# Patient Record
Sex: Female | Born: 1940 | Race: Black or African American | Hispanic: No | Smoking: Former smoker
Health system: Southern US, Community
[De-identification: ages and names within clinical notes are randomized; demographics above are authoritative.]

## PROBLEM LIST (undated history)

## (undated) DIAGNOSIS — N189 Chronic kidney disease, unspecified: Secondary | ICD-10-CM

## (undated) HISTORY — PX: COLONOSCOPY: SHX174

## (undated) HISTORY — PX: EYE SURGERY: SHX253

---

## 1998-07-19 ENCOUNTER — Other Ambulatory Visit: Admission: RE | Admit: 1998-07-19 | Discharge: 1998-07-19 | Payer: Self-pay | Admitting: Obstetrics and Gynecology

## 1999-04-28 ENCOUNTER — Other Ambulatory Visit: Admission: RE | Admit: 1999-04-28 | Discharge: 1999-04-28 | Payer: Self-pay | Admitting: Family Medicine

## 1999-06-08 ENCOUNTER — Encounter: Payer: Self-pay | Admitting: Family Medicine

## 1999-06-08 ENCOUNTER — Encounter: Admission: RE | Admit: 1999-06-08 | Discharge: 1999-06-08 | Payer: Self-pay | Admitting: Family Medicine

## 2000-06-11 ENCOUNTER — Encounter: Payer: Self-pay | Admitting: Family Medicine

## 2000-06-11 ENCOUNTER — Encounter: Admission: RE | Admit: 2000-06-11 | Discharge: 2000-06-11 | Payer: Self-pay | Admitting: Family Medicine

## 2000-06-27 ENCOUNTER — Other Ambulatory Visit: Admission: RE | Admit: 2000-06-27 | Discharge: 2000-06-27 | Payer: Self-pay | Admitting: Obstetrics and Gynecology

## 2001-06-17 ENCOUNTER — Encounter: Payer: Self-pay | Admitting: Family Medicine

## 2001-06-17 ENCOUNTER — Encounter: Admission: RE | Admit: 2001-06-17 | Discharge: 2001-06-17 | Payer: Self-pay | Admitting: Family Medicine

## 2001-09-08 ENCOUNTER — Other Ambulatory Visit: Admission: RE | Admit: 2001-09-08 | Discharge: 2001-09-08 | Payer: Self-pay | Admitting: Obstetrics and Gynecology

## 2002-07-14 ENCOUNTER — Encounter: Admission: RE | Admit: 2002-07-14 | Discharge: 2002-07-14 | Payer: Self-pay | Admitting: Family Medicine

## 2002-07-14 ENCOUNTER — Encounter: Payer: Self-pay | Admitting: Family Medicine

## 2003-01-20 ENCOUNTER — Other Ambulatory Visit: Admission: RE | Admit: 2003-01-20 | Discharge: 2003-01-20 | Payer: Self-pay | Admitting: Obstetrics and Gynecology

## 2003-07-16 ENCOUNTER — Ambulatory Visit (HOSPITAL_COMMUNITY): Admission: RE | Admit: 2003-07-16 | Discharge: 2003-07-16 | Payer: Self-pay | Admitting: Family Medicine

## 2004-08-10 ENCOUNTER — Encounter: Admission: RE | Admit: 2004-08-10 | Discharge: 2004-08-10 | Payer: Self-pay | Admitting: Family Medicine

## 2004-08-14 ENCOUNTER — Ambulatory Visit: Payer: Self-pay | Admitting: Family Medicine

## 2004-11-23 ENCOUNTER — Other Ambulatory Visit: Admission: RE | Admit: 2004-11-23 | Discharge: 2004-11-23 | Payer: Self-pay | Admitting: Family Medicine

## 2004-12-13 ENCOUNTER — Ambulatory Visit (HOSPITAL_COMMUNITY): Admission: RE | Admit: 2004-12-13 | Discharge: 2004-12-13 | Payer: Self-pay | Admitting: Gastroenterology

## 2005-10-08 ENCOUNTER — Encounter: Admission: RE | Admit: 2005-10-08 | Discharge: 2005-10-08 | Payer: Self-pay | Admitting: Family Medicine

## 2005-11-27 ENCOUNTER — Other Ambulatory Visit: Admission: RE | Admit: 2005-11-27 | Discharge: 2005-11-27 | Payer: Self-pay | Admitting: Family Medicine

## 2006-10-22 ENCOUNTER — Encounter: Admission: RE | Admit: 2006-10-22 | Discharge: 2006-10-22 | Payer: Self-pay | Admitting: Family Medicine

## 2007-07-28 ENCOUNTER — Encounter: Admission: RE | Admit: 2007-07-28 | Discharge: 2007-07-28 | Payer: Self-pay | Admitting: Family Medicine

## 2007-11-18 ENCOUNTER — Encounter: Admission: RE | Admit: 2007-11-18 | Discharge: 2007-11-18 | Payer: Self-pay | Admitting: Family Medicine

## 2008-11-18 ENCOUNTER — Encounter: Admission: RE | Admit: 2008-11-18 | Discharge: 2008-11-18 | Payer: Self-pay | Admitting: Family Medicine

## 2009-11-10 ENCOUNTER — Other Ambulatory Visit: Admission: RE | Admit: 2009-11-10 | Discharge: 2009-11-10 | Payer: Self-pay | Admitting: Family Medicine

## 2009-11-21 ENCOUNTER — Encounter: Admission: RE | Admit: 2009-11-21 | Discharge: 2009-11-21 | Payer: Self-pay | Admitting: Family Medicine

## 2010-05-21 ENCOUNTER — Encounter: Payer: Self-pay | Admitting: Family Medicine

## 2010-09-15 NOTE — Op Note (Signed)
NAME:  [name], [name]             ACCOUNT NO.:  [id number]   MEDICAL RECORD NO.:  [id number]          PATIENT TYPE:  AMB   LOCATION:  ENDO                         FACILITY:  [facility]   PHYSICIAN:  Anselmo Rod, M.D.  DATE OF BIRTH:  1940-06-16   DATE OF PROCEDURE:  12/13/2004  DATE OF DISCHARGE:                                 OPERATIVE REPORT   PROCEDURE PERFORMED:  Screening colonoscopy.   ENDOSCOPIST:  Charna Elizabeth, M.D.   INSTRUMENT USED:  Olympus video colonoscope.   INDICATIONS FOR PROCEDURE:  The patient is a 70 year old African-American  female undergoing screening colonoscopy to rule out colonic polyps, masses,  etc.   PREPROCEDURE PREPARATION:  Informed consent was procured from the patient.  The patient was fasted for eight hours prior to the procedure and prepped  with a bottle of magnesium citrate and a gallon of GoLYTELY the night prior  to the procedure.  The risks and benefits of the procedure including a 10%  miss rate for colon polyps or cancers was discussed with the patient as  well.   PREPROCEDURE PHYSICAL:  The patient had stable vital signs.  Neck supple.  Chest clear to auscultation.  S1 and S2 regular.  Abdomen soft with normal  bowel sounds.   DESCRIPTION OF PROCEDURE:  The patient was placed in left lateral decubitus  position and sedated with 60 mg of Demerol and 6 mg of Versed in slow  incremental doses.  Once the patient was adequately sedated and maintained  on low flow oxygen and continuous cardiac monitoring, the Olympus video  colonoscope was advanced from the rectum to the cecum with difficulty.  The  patient's position was changed from the left lateral to the supine position  with gentle application of abdominal pressure to reach the cecal base.  The  appendicular orifice and ileocecal valve were clearly visualized and  photographed.  No masses, polyps, erosions, ulcerations or diverticula were  seen.  Retroflexion in the rectum revealed no  abnormalities.  The patient  tolerated the procedure well without immediate complications.   IMPRESSION:  Normal colonoscopy up to the cecum.  The patient had a tortuous  colon.  No masses, polyps or diverticula seen.   RECOMMENDATIONS:  1.  Continue high fiber diet with liberal fluid intake.  2.  Repeat colonoscopy is recommended in the next 10 years unless the      patient develops any abnormal symptoms in the interim.  3.  Outpatient followup as need arises in the future.      Anselmo Rod, M.D.  Electronically Signed     JNM/MEDQ  D:  12/13/2004  T:  12/13/2004  Job:  [id number]   cc:   Renaye Rakers, M.D.  [phone] [address]  [place]  [place] [address]  Fax: [phone]

## 2010-10-13 ENCOUNTER — Ambulatory Visit (HOSPITAL_BASED_OUTPATIENT_CLINIC_OR_DEPARTMENT_OTHER)
Admission: RE | Admit: 2010-10-13 | Discharge: 2010-10-13 | Disposition: A | Discharge status: Home / Self Care | Payer: Medicare Other | Source: Ambulatory Visit | Attending: Orthopedic Surgery | Admitting: Orthopedic Surgery

## 2010-10-13 DIAGNOSIS — Z01812 Encounter for preprocedural laboratory examination: Secondary | ICD-10-CM | POA: Insufficient documentation

## 2010-10-13 DIAGNOSIS — M6740 Ganglion, unspecified site: Secondary | ICD-10-CM | POA: Insufficient documentation

## 2010-10-17 NOTE — Op Note (Signed)
NAME:  [name], [name]             ACCOUNT NO.:  [id number]  MEDICAL RECORD NO.:  [id number]  LOCATION:                                 FACILITY:  PHYSICIAN:  Katy Fitch. Derriana Oser, M.D.      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  OPERATION:  Resection of myxoid cyst on the dorsal aspect of right wrist with wrist arthrotomy and debridement of the cyst base at scapholunate ligament followed by electrocautery of superficial cells of scapholunate ligament.  OPERATIONS:  Katy Fitch. Kypton Eltringham, MD  ASSISTANT:  Marveen Reeks Dasnoit, PA-C  ANESTHESIA:  General by LMA.  SUPERVISING ANESTHESIOLOGIST:  Achille Rich, MD  INDICATIONS:  [name] is a 70 year old woman referred for evaluation and management of a mass on the dorsal aspect of her right wrist.  Her primary care physician is Dr. Renaye Rakers.  Clinical examination revealed a probable myxoid/ganglion cyst that was dissecting through the extensor retinaculum.  We reported her this was benign process that she could tolerate as long as she chose to.  It hurts and is uncomfortable.  She did not like the appearance of it and requested that we remove the cyst.  After informed consent by myself and Dr. Chaney Malling, she is brought to the operating room at this time anticipating general anesthesia by LMA technique.  Questions were invited and answered in detail preoperatively.  PROCEDURE:  [name] was brought to room #1 of the [facility] and placed in supine position upon the operating table. Following the induction of general  anesthesia by LMA technique under Dr. Seward Meth direct supervision, the right arm was prepped with Betadine soap and solution, sterilely draped.  A pneumatic tourniquet was applied to the proximal right brachium.  Following exsanguination of the right arm with Esmarch bandage, arterial tourniquet was inflated to 220 mmHg.  After routine surgical time-out, the procedure was commenced with a short transverse incision directly over the apex of the mass.  Subcutaneous tissues were carefully divided taking care to identify the extensor retinaculum.  The cyst was dissected through the retinaculum.  The cyst was circumferentially dissected and vessels passing through the wall of the cyst were electrocauterized with bipolar current.  The cyst was drained of about 90% of its contents and a right-angle clamp applied.  This was followed back through the dorsal capsule of the wrist joint which was dissected with microforceps, with electrocautery along the way of feeding vessels. The cyst followed a pathway directly through the scapholunate ligament. The cyst was amputated from the dorsal surface of the scapholunate ligament and a micro-rongeur was used to clean the superficial fibers and ligament.  The ligament was then electrodesiccated on the superficial surface to try to prevent recurrence.  The wound was then irrigated.  The wrist joint was inspected and no other pathology noted.  The capsule was  then allowed to simply close in anatomic layers.  The skin was repaired with subcutaneous 4-0 Vicryl and dermal 3-0 Prolene with Steri-Strips.  A compressive dressing was applied with volar plaster splint maintaining the wrist in 10 degrees of dorsiflexion.  There were no apparent complications.  For aftercare, [name] is provided prescription for Percocet 5 mg 1 p.o. q.4-6 h. p.r.n. pain, 20 tablets without refill. We will see her back for followup in approximately 7 days for dressing change,  suture removal, and advancement to an excise program.     Katy Fitch. Gildardo Tickner, M.D.     RVS/MEDQ  D:  10/13/2010  T:  10/13/2010  Job:  [id number]  Electronically Signed by Josephine Igo M.D. on 10/17/2010 08:28:10 AM

## 2010-12-15 ENCOUNTER — Other Ambulatory Visit: Payer: Self-pay | Admitting: Family Medicine

## 2010-12-15 DIAGNOSIS — Z1231 Encounter for screening mammogram for malignant neoplasm of breast: Secondary | ICD-10-CM

## 2010-12-21 ENCOUNTER — Ambulatory Visit
Admission: RE | Admit: 2010-12-21 | Discharge: 2010-12-21 | Disposition: A | Discharge status: Home / Self Care | Payer: Medicare Other | Source: Ambulatory Visit | Attending: Family Medicine | Admitting: Family Medicine

## 2010-12-21 DIAGNOSIS — Z1231 Encounter for screening mammogram for malignant neoplasm of breast: Secondary | ICD-10-CM

## 2011-02-07 ENCOUNTER — Encounter (HOSPITAL_COMMUNITY)
Admission: RE | Admit: 2011-02-07 | Discharge: 2011-02-07 | Disposition: A | Discharge status: Home / Self Care | Payer: Medicare Other | Source: Ambulatory Visit | Attending: Ophthalmology | Admitting: Ophthalmology

## 2011-02-07 LAB — CBC
HCT: 41.6 % (ref 36.0–46.0)
Hemoglobin: 14.2 g/dL (ref 12.0–15.0)
MCH: 29.2 pg (ref 26.0–34.0)
MCV: 85.6 fL (ref 78.0–100.0)
Platelets: 189 10*3/uL (ref 150–400)
RDW: 14.8 % (ref 11.5–15.5)
WBC: 4.7 10*3/uL (ref 4.0–10.5)

## 2011-02-14 ENCOUNTER — Ambulatory Visit (HOSPITAL_COMMUNITY)
Admission: RE | Admit: 2011-02-14 | Discharge: 2011-02-14 | Disposition: A | Discharge status: Home / Self Care | Payer: Medicare Other | Source: Ambulatory Visit | Attending: Ophthalmology | Admitting: Ophthalmology

## 2011-02-14 DIAGNOSIS — Z01818 Encounter for other preprocedural examination: Secondary | ICD-10-CM | POA: Insufficient documentation

## 2011-02-14 DIAGNOSIS — H269 Unspecified cataract: Secondary | ICD-10-CM | POA: Insufficient documentation

## 2011-02-18 NOTE — Op Note (Signed)
  NAME:  [name], [name]             ACCOUNT NO.:  [id number]  MEDICAL RECORD NO.:  [id number]  LOCATION:  SDSC                         FACILITY:  [facility]  PHYSICIAN:  Shade Flood, MD       DATE OF BIRTH:  Nov 04, 1940  DATE OF PROCEDURE:  02/14/2011 DATE OF DISCHARGE:                              OPERATIVE REPORT   PREOPERATIVE DIAGNOSIS:  Cataract, right eye.  POSTOPERATIVE DIAGNOSIS:  Cataract, right eye.  PROCEDURE PERFORMED:  Phacoemulsification with posterior chamber intraocular lens, right eye.  SURGEON:  Shade Flood, MD  COMPLICATIONS:  There were no complications.  SPECIMENS:  No specimens for Pathology.  DESCRIPTION OF PROCEDURE:  The patient was prepared and draped in usual fashion for ocular surgery on the right eye and a solid lid speculum was placed.  Calipers were used to show 3.5 mm at the surgical limbus centered at the 11 o'clock meridian.  A Grieshaber 0.1 blade was used to make a peripheral partial-thickness corneal groove.  A 15-degree blade was used to enter the anterior chamber at the 2:30 meridian of the surgical limbus.  Provisc was instilled and then a keratome was used to make a self-sealing corneal incision at the 11 o'clock meridian.  A bent 25-gauge needle was then used to perform the capsulorrhexis.  The lens was hydrodissected using balanced salt solution, and then the Chang chopper was used to rotate the lens within the capsular bag to ensure adequate mobility.  The phaco handpiece and Chang chopper were then employed to use a combined Phaoc/Chop technique, removing the nucleus without complication.  Once the nucleus was removed, the IA cannula was used to remove the residual cortex from the capsular bag.  Provisc was again instilled and the wound enlarged to 3.5 mm.   The Monarch injector was then used to place a folded AcrySof MA50BM lens into the capsule bag.  The Sinskey lens hook was then used to dial the trailing haptic into the bag and  the IA cannula was used to remove the viscoelastic from the anterior chamber.  The wound was tested for water tightness and it did appear to self-seal well.  The patient was given 100 mg of Ancef subconjunctivally with the needle visualized and 2 mg Decadron inferiorly subconjunctivally also with the needle visualized.  The lid speculum was removed and the patient's eye was patched using polymyxin/bacitracin ophthalmic ointment.  A plastic shield was placed and she was transferred alert and conversant from the operating room to the postoperative recovery area.         ______________________________ Shade Flood, MD    GG/MEDQ  D:  02/14/2011  T:  02/15/2011  Job:  [id number]  Electronically Signed by Shade Flood MD on 02/18/2011 06:09:47 PM

## 2011-05-11 DIAGNOSIS — H20019 Primary iridocyclitis, unspecified eye: Secondary | ICD-10-CM | POA: Diagnosis not present

## 2011-05-11 DIAGNOSIS — H5710 Ocular pain, unspecified eye: Secondary | ICD-10-CM | POA: Diagnosis not present

## 2011-06-04 DIAGNOSIS — H2510 Age-related nuclear cataract, unspecified eye: Secondary | ICD-10-CM | POA: Diagnosis not present

## 2011-06-04 DIAGNOSIS — H20019 Primary iridocyclitis, unspecified eye: Secondary | ICD-10-CM | POA: Diagnosis not present

## 2011-07-02 DIAGNOSIS — I10 Essential (primary) hypertension: Secondary | ICD-10-CM | POA: Diagnosis not present

## 2011-07-02 DIAGNOSIS — E559 Vitamin D deficiency, unspecified: Secondary | ICD-10-CM | POA: Diagnosis not present

## 2011-07-05 DIAGNOSIS — H25019 Cortical age-related cataract, unspecified eye: Secondary | ICD-10-CM | POA: Diagnosis not present

## 2011-07-05 DIAGNOSIS — H20019 Primary iridocyclitis, unspecified eye: Secondary | ICD-10-CM | POA: Diagnosis not present

## 2011-08-08 ENCOUNTER — Other Ambulatory Visit (HOSPITAL_COMMUNITY)
Admission: RE | Admit: 2011-08-08 | Discharge: 2011-08-08 | Disposition: A | Discharge status: Home / Self Care | Payer: Medicare Other | Source: Ambulatory Visit | Attending: Family Medicine | Admitting: Family Medicine

## 2011-08-08 DIAGNOSIS — E119 Type 2 diabetes mellitus without complications: Secondary | ICD-10-CM | POA: Diagnosis not present

## 2011-08-08 DIAGNOSIS — Z124 Encounter for screening for malignant neoplasm of cervix: Secondary | ICD-10-CM | POA: Diagnosis not present

## 2011-08-23 DIAGNOSIS — N760 Acute vaginitis: Secondary | ICD-10-CM | POA: Diagnosis not present

## 2011-08-23 DIAGNOSIS — E7800 Pure hypercholesterolemia, unspecified: Secondary | ICD-10-CM | POA: Diagnosis not present

## 2011-08-31 DIAGNOSIS — N909 Noninflammatory disorder of vulva and perineum, unspecified: Secondary | ICD-10-CM | POA: Diagnosis not present

## 2011-08-31 DIAGNOSIS — N9089 Other specified noninflammatory disorders of vulva and perineum: Secondary | ICD-10-CM | POA: Diagnosis not present

## 2011-08-31 DIAGNOSIS — N766 Ulceration of vulva: Secondary | ICD-10-CM | POA: Diagnosis not present

## 2011-09-07 DIAGNOSIS — N766 Ulceration of vulva: Secondary | ICD-10-CM | POA: Diagnosis not present

## 2011-09-07 DIAGNOSIS — N909 Noninflammatory disorder of vulva and perineum, unspecified: Secondary | ICD-10-CM | POA: Diagnosis not present

## 2011-10-09 DIAGNOSIS — L259 Unspecified contact dermatitis, unspecified cause: Secondary | ICD-10-CM | POA: Diagnosis not present

## 2011-10-09 DIAGNOSIS — L940 Localized scleroderma [morphea]: Secondary | ICD-10-CM | POA: Diagnosis not present

## 2011-12-04 DIAGNOSIS — H04129 Dry eye syndrome of unspecified lacrimal gland: Secondary | ICD-10-CM | POA: Diagnosis not present

## 2011-12-04 DIAGNOSIS — H2510 Age-related nuclear cataract, unspecified eye: Secondary | ICD-10-CM | POA: Diagnosis not present

## 2011-12-04 DIAGNOSIS — Z961 Presence of intraocular lens: Secondary | ICD-10-CM | POA: Diagnosis not present

## 2011-12-21 DIAGNOSIS — E7800 Pure hypercholesterolemia, unspecified: Secondary | ICD-10-CM | POA: Diagnosis not present

## 2011-12-21 DIAGNOSIS — I10 Essential (primary) hypertension: Secondary | ICD-10-CM | POA: Diagnosis not present

## 2012-01-01 ENCOUNTER — Other Ambulatory Visit: Payer: Self-pay | Admitting: Family Medicine

## 2012-01-01 DIAGNOSIS — Z1231 Encounter for screening mammogram for malignant neoplasm of breast: Secondary | ICD-10-CM

## 2012-01-16 DIAGNOSIS — H01139 Eczematous dermatitis of unspecified eye, unspecified eyelid: Secondary | ICD-10-CM | POA: Diagnosis not present

## 2012-01-16 DIAGNOSIS — L940 Localized scleroderma [morphea]: Secondary | ICD-10-CM | POA: Diagnosis not present

## 2012-01-17 ENCOUNTER — Ambulatory Visit
Admission: RE | Admit: 2012-01-17 | Discharge: 2012-01-17 | Disposition: A | Discharge status: Home / Self Care | Payer: Medicare Other | Source: Ambulatory Visit | Attending: Family Medicine | Admitting: Family Medicine

## 2012-01-17 DIAGNOSIS — Z1231 Encounter for screening mammogram for malignant neoplasm of breast: Secondary | ICD-10-CM | POA: Diagnosis not present

## 2012-04-16 DIAGNOSIS — L940 Localized scleroderma [morphea]: Secondary | ICD-10-CM | POA: Diagnosis not present

## 2012-04-16 DIAGNOSIS — L819 Disorder of pigmentation, unspecified: Secondary | ICD-10-CM | POA: Diagnosis not present

## 2012-05-20 DIAGNOSIS — M7989 Other specified soft tissue disorders: Secondary | ICD-10-CM | POA: Diagnosis not present

## 2012-05-20 DIAGNOSIS — E7800 Pure hypercholesterolemia, unspecified: Secondary | ICD-10-CM | POA: Diagnosis not present

## 2012-06-23 DIAGNOSIS — H35359 Cystoid macular degeneration, unspecified eye: Secondary | ICD-10-CM | POA: Diagnosis not present

## 2012-07-11 ENCOUNTER — Encounter (HOSPITAL_COMMUNITY): Payer: Self-pay | Admitting: Pharmacy Technician

## 2012-07-25 ENCOUNTER — Encounter: Payer: Self-pay | Admitting: Ophthalmology

## 2012-07-25 ENCOUNTER — Encounter (HOSPITAL_COMMUNITY)
Admission: RE | Disposition: A | Discharge status: Home / Self Care | Payer: Self-pay | Source: Ambulatory Visit | Attending: Ophthalmology

## 2012-07-25 ENCOUNTER — Ambulatory Visit (HOSPITAL_COMMUNITY)
Admission: RE | Admit: 2012-07-25 | Discharge: 2012-07-25 | Disposition: A | Discharge status: Home / Self Care | Payer: Medicare Other | Source: Ambulatory Visit | Attending: Ophthalmology | Admitting: Ophthalmology

## 2012-07-25 ENCOUNTER — Other Ambulatory Visit: Payer: Self-pay | Admitting: Ophthalmology

## 2012-07-25 ENCOUNTER — Encounter (HOSPITAL_COMMUNITY): Payer: Self-pay | Admitting: Ophthalmology

## 2012-07-25 DIAGNOSIS — Z87891 Personal history of nicotine dependence: Secondary | ICD-10-CM | POA: Diagnosis not present

## 2012-07-25 DIAGNOSIS — E7800 Pure hypercholesterolemia, unspecified: Secondary | ICD-10-CM | POA: Diagnosis not present

## 2012-07-25 DIAGNOSIS — H25019 Cortical age-related cataract, unspecified eye: Secondary | ICD-10-CM | POA: Insufficient documentation

## 2012-07-25 DIAGNOSIS — Z79899 Other long term (current) drug therapy: Secondary | ICD-10-CM | POA: Diagnosis not present

## 2012-07-25 DIAGNOSIS — Z7982 Long term (current) use of aspirin: Secondary | ICD-10-CM | POA: Diagnosis not present

## 2012-07-25 DIAGNOSIS — H26499 Other secondary cataract, unspecified eye: Secondary | ICD-10-CM | POA: Diagnosis not present

## 2012-07-25 DIAGNOSIS — H35359 Cystoid macular degeneration, unspecified eye: Secondary | ICD-10-CM | POA: Insufficient documentation

## 2012-07-25 DIAGNOSIS — H04129 Dry eye syndrome of unspecified lacrimal gland: Secondary | ICD-10-CM | POA: Insufficient documentation

## 2012-07-25 DIAGNOSIS — H2510 Age-related nuclear cataract, unspecified eye: Secondary | ICD-10-CM | POA: Diagnosis not present

## 2012-07-25 HISTORY — PX: CAPSULOTOMY: SHX5412

## 2012-07-25 SURGERY — MINOR CAPSULOTOMY
Anesthesia: LOCAL | Site: Eye | Laterality: Right

## 2012-07-25 MED ORDER — CYCLOPENTOLATE-PHENYLEPHRINE 0.2-1 % OP SOLN: 2.0000 [drp] | Freq: Once | OPHTHALMIC | Status: DC

## 2012-07-25 MED ORDER — APRACLONIDINE HCL 1 % OP SOLN
1.0000 [drp] | Freq: Once | OPHTHALMIC | Status: AC
  Administered 2012-07-25: 1 [drp] via OPHTHALMIC
  Filled 2012-07-25: qty 0.1

## 2012-07-25 MED ORDER — APRACLONIDINE HCL 1 % OP SOLN: 1.0000 [drp] | Freq: Once | OPHTHALMIC | Status: DC

## 2012-07-25 MED ORDER — APRACLONIDINE HCL 0.5 % OP SOLN
2.0000 [drp] | Freq: Once | OPHTHALMIC | Status: DC
  Filled 2012-07-25 (×2): qty 5

## 2012-07-25 SURGICAL SUPPLY — 28 items
APPLICATOR COTTON TIP 6IN STRL (MISCELLANEOUS) ×2 IMPLANT
BAG FLD CLT MN 6.25X3.5 (WOUND CARE)
BAG MINI COLL DRAIN (WOUND CARE) IMPLANT
BLADE KERATOME 2.75 (BLADE) ×2 IMPLANT
BLADE MINI RND TIP GREEN BEAV (BLADE) IMPLANT
CLOTH BEACON ORANGE TIMEOUT ST (SAFETY) ×2 IMPLANT
CORDS BIPOLAR (ELECTRODE) ×2 IMPLANT
DRAPE OPHTHALMIC 40X48 W POUCH (DRAPES) ×2 IMPLANT
DRAPE RETRACTOR (MISCELLANEOUS) ×2 IMPLANT
GLOVE ECLIPSE 7.0 STRL STRAW (GLOVE) ×2 IMPLANT
GOWN STRL NON-REIN LRG LVL3 (GOWN DISPOSABLE) ×4 IMPLANT
KIT BASIN OR (CUSTOM PROCEDURE TRAY) ×2 IMPLANT
KIT ROOM TURNOVER OR (KITS) ×2 IMPLANT
KNIFE CRESCENT 2.5 55 ANG (BLADE) ×2 IMPLANT
MARKER SKIN DUAL TIP RULER LAB (MISCELLANEOUS) IMPLANT
NS IRRIG 1000ML POUR BTL (IV SOLUTION) ×2 IMPLANT
PACK CATARACT CUSTOM (CUSTOM PROCEDURE TRAY) ×2 IMPLANT
PACK CATARACT MCHSCP (PACKS) IMPLANT
PAD ARMBOARD 7.5X6 YLW CONV (MISCELLANEOUS) ×4 IMPLANT
PROBE ANTERIOR 20G W/INFUS NDL (MISCELLANEOUS) IMPLANT
SPEAR EYE SURG WECK-CEL (MISCELLANEOUS) IMPLANT
SUT ETHILON 10 0 CS140 6 (SUTURE) IMPLANT
SUT VICRYL 8 0 TG140 8 (SUTURE) IMPLANT
SYR 3ML LL SCALE MARK (SYRINGE) IMPLANT
TIP SILICONE STR (MISCELLANEOUS)
TIP SILICONE STR 0.3MM UFLOW (MISCELLANEOUS) IMPLANT
TOWEL OR 17X24 6PK STRL BLUE (TOWEL DISPOSABLE) ×4 IMPLANT
WATER STERILE IRR 1000ML POUR (IV SOLUTION) ×2 IMPLANT

## 2012-07-25 NOTE — Progress Notes (Signed)
Spoke with Dr. Mitzi Davenport and he stated he only wants iopodine given (1 gtt pre/post procedure).

## 2012-07-25 NOTE — H&P (Signed)
  72 yo female has had cataract surgery right eye.  C/O blurred vision.  Found to have a cloudy posterior capsule.  Admitted for yag laser capsulotomy capsulotomy right eye.

## 2012-07-25 NOTE — Progress Notes (Signed)
Patient discharged to home and will follow up today at 1700 in Dr. Jacelyn Pi office. Patient verbalized understanding.

## 2012-07-25 NOTE — Brief Op Note (Signed)
07/25/2012  7:56 AM  PATIENT:  [name]  72 y.o. female  PRE-OPERATIVE DIAGNOSIS:  opaque posterior capsule right eye   POST-OPERATIVE DIAGNOSIS:  * No post-op diagnosis entered *  PROCEDURE:  Procedure(s): MINOR PROCEDURE YAG LASER CAPSULOTOMY (Right)  SURGEON:  Surgeon(s) and Role:    Vita Erm., MD - Primary  PHYSICIAN ASSISTANT:   ASSISTANTS: none   ANESTHESIA:   none  EBL:     BLOOD ADMINISTERED:none  DRAINS: none   LOCAL MEDICATIONS USED:  NONE  SPECIMEN:  No Specimen  DISPOSITION OF SPECIMEN:  N/A  COUNTS:  YES  TOURNIQUET:  * No tourniquets in log *  DICTATION: .Other Dictation: Dictation Number [id number]  PLAN OF CARE: Discharge to home after PACU  PATIENT DISPOSITION:  Short Stay   Delay start of Pharmacological VTE agent (>24hrs) due to surgical blood loss or risk of bleeding: not applicable

## 2012-07-28 ENCOUNTER — Encounter (HOSPITAL_COMMUNITY): Payer: Self-pay | Admitting: Ophthalmology

## 2012-08-01 NOTE — Op Note (Signed)
NAME:  BARRIE, SIGMUND                  ACCOUNT NO.:  MEDICAL RECORD NO.:  [id number]  LOCATION:                                 FACILITY:  PHYSICIAN:  Salley Scarlet., M.D.DATE OF BIRTH:  [birth year]  DATE OF PROCEDURE: DATE OF DISCHARGE:                              OPERATIVE REPORT   PREOPERATIVE DIAGNOSIS:  Opaque posterior capsule, right eye.  POSTOPERATIVE DIAGNOSIS:  Opaque posterior capsule, right eye.  OPERATION:  YAG laser capsulotomy.  JUSTIFICATION FOR PROCEDURE:  This is a 72 year old lady who had a cataract extraction from the right eye several months ago.  She complains of blurring of vision.  She was evaluated for having an opaque posterior capsule of the right eye.  YAG laser capsulotomy was recommended.  She is admitted at this time for that purpose.  DESCRIPTION OF PROCEDURE:  The patient was brought to the laser room and positioned appropriately behind the YAG laser.  Approximately 25 applications of laser energy, 1.8 mJ applied to the posterior capsule obtaining a satisfactory opening.  The patient tolerated the procedure well and was discharged in satisfactory condition, with instructions to see me in office this afternoon for further evaluation.  DISCHARGE DIAGNOSIS:  Opaque posterior capsule, right eye.     Salley Scarlet., M.D.     TB/MEDQ  D:  07/31/2012  T:  08/01/2012  Job:  [id number]

## 2012-09-15 DIAGNOSIS — I10 Essential (primary) hypertension: Secondary | ICD-10-CM | POA: Diagnosis not present

## 2012-09-17 DIAGNOSIS — E7800 Pure hypercholesterolemia, unspecified: Secondary | ICD-10-CM | POA: Diagnosis not present

## 2012-09-17 DIAGNOSIS — I10 Essential (primary) hypertension: Secondary | ICD-10-CM | POA: Diagnosis not present

## 2012-10-07 DIAGNOSIS — L940 Localized scleroderma [morphea]: Secondary | ICD-10-CM | POA: Diagnosis not present

## 2012-10-07 DIAGNOSIS — L819 Disorder of pigmentation, unspecified: Secondary | ICD-10-CM | POA: Diagnosis not present

## 2013-01-07 ENCOUNTER — Other Ambulatory Visit: Payer: Self-pay

## 2013-01-07 DIAGNOSIS — Z1231 Encounter for screening mammogram for malignant neoplasm of breast: Secondary | ICD-10-CM

## 2013-01-26 DIAGNOSIS — H04129 Dry eye syndrome of unspecified lacrimal gland: Secondary | ICD-10-CM | POA: Diagnosis not present

## 2013-01-26 DIAGNOSIS — H2510 Age-related nuclear cataract, unspecified eye: Secondary | ICD-10-CM | POA: Diagnosis not present

## 2013-01-30 ENCOUNTER — Ambulatory Visit
Admission: RE | Admit: 2013-01-30 | Discharge: 2013-01-30 | Disposition: A | Discharge status: Home / Self Care | Payer: Medicare Other | Source: Ambulatory Visit

## 2013-01-30 DIAGNOSIS — Z1231 Encounter for screening mammogram for malignant neoplasm of breast: Secondary | ICD-10-CM

## 2013-02-03 DIAGNOSIS — L940 Localized scleroderma [morphea]: Secondary | ICD-10-CM | POA: Diagnosis not present

## 2013-02-16 DIAGNOSIS — I10 Essential (primary) hypertension: Secondary | ICD-10-CM | POA: Diagnosis not present

## 2013-02-16 DIAGNOSIS — H902 Conductive hearing loss, unspecified: Secondary | ICD-10-CM | POA: Diagnosis not present

## 2013-02-16 DIAGNOSIS — G589 Mononeuropathy, unspecified: Secondary | ICD-10-CM | POA: Diagnosis not present

## 2013-02-16 DIAGNOSIS — G5790 Unspecified mononeuropathy of unspecified lower limb: Secondary | ICD-10-CM | POA: Diagnosis not present

## 2013-03-03 DIAGNOSIS — H93299 Other abnormal auditory perceptions, unspecified ear: Secondary | ICD-10-CM | POA: Diagnosis not present

## 2013-03-04 DIAGNOSIS — G603 Idiopathic progressive neuropathy: Secondary | ICD-10-CM | POA: Diagnosis not present

## 2013-04-13 DIAGNOSIS — H1045 Other chronic allergic conjunctivitis: Secondary | ICD-10-CM | POA: Diagnosis not present

## 2013-04-13 DIAGNOSIS — H02049 Spastic entropion of unspecified eye, unspecified eyelid: Secondary | ICD-10-CM | POA: Diagnosis not present

## 2013-05-07 DIAGNOSIS — H04129 Dry eye syndrome of unspecified lacrimal gland: Secondary | ICD-10-CM | POA: Diagnosis not present

## 2013-05-10 DIAGNOSIS — H5710 Ocular pain, unspecified eye: Secondary | ICD-10-CM | POA: Diagnosis not present

## 2013-05-10 DIAGNOSIS — H20019 Primary iridocyclitis, unspecified eye: Secondary | ICD-10-CM | POA: Diagnosis not present

## 2013-05-20 DIAGNOSIS — H5710 Ocular pain, unspecified eye: Secondary | ICD-10-CM | POA: Diagnosis not present

## 2013-05-20 DIAGNOSIS — H20019 Primary iridocyclitis, unspecified eye: Secondary | ICD-10-CM | POA: Diagnosis not present

## 2013-06-10 DIAGNOSIS — H20019 Primary iridocyclitis, unspecified eye: Secondary | ICD-10-CM | POA: Diagnosis not present

## 2013-06-10 DIAGNOSIS — H16229 Keratoconjunctivitis sicca, not specified as Sjogren's, unspecified eye: Secondary | ICD-10-CM | POA: Diagnosis not present

## 2013-08-10 DIAGNOSIS — E119 Type 2 diabetes mellitus without complications: Secondary | ICD-10-CM | POA: Diagnosis not present

## 2013-08-10 DIAGNOSIS — I10 Essential (primary) hypertension: Secondary | ICD-10-CM | POA: Diagnosis not present

## 2013-09-04 DIAGNOSIS — H2589 Other age-related cataract: Secondary | ICD-10-CM | POA: Diagnosis not present

## 2014-01-07 DIAGNOSIS — E669 Obesity, unspecified: Secondary | ICD-10-CM | POA: Diagnosis not present

## 2014-01-07 DIAGNOSIS — I10 Essential (primary) hypertension: Secondary | ICD-10-CM | POA: Diagnosis not present

## 2014-01-07 DIAGNOSIS — E119 Type 2 diabetes mellitus without complications: Secondary | ICD-10-CM | POA: Diagnosis not present

## 2014-01-18 ENCOUNTER — Other Ambulatory Visit: Payer: Self-pay

## 2014-01-18 DIAGNOSIS — Z1231 Encounter for screening mammogram for malignant neoplasm of breast: Secondary | ICD-10-CM

## 2014-02-01 ENCOUNTER — Ambulatory Visit: Payer: Medicare Other

## 2014-02-03 DIAGNOSIS — L900 Lichen sclerosus et atrophicus: Secondary | ICD-10-CM | POA: Diagnosis not present

## 2014-02-03 DIAGNOSIS — L810 Postinflammatory hyperpigmentation: Secondary | ICD-10-CM | POA: Diagnosis not present

## 2014-02-10 ENCOUNTER — Ambulatory Visit
Admission: RE | Admit: 2014-02-10 | Discharge: 2014-02-10 | Disposition: A | Discharge status: Home / Self Care | Payer: Medicare Other | Source: Ambulatory Visit

## 2014-02-10 DIAGNOSIS — Z1231 Encounter for screening mammogram for malignant neoplasm of breast: Secondary | ICD-10-CM | POA: Diagnosis not present

## 2014-03-04 DIAGNOSIS — H2512 Age-related nuclear cataract, left eye: Secondary | ICD-10-CM | POA: Diagnosis not present

## 2014-03-04 DIAGNOSIS — H04123 Dry eye syndrome of bilateral lacrimal glands: Secondary | ICD-10-CM | POA: Diagnosis not present

## 2014-03-04 DIAGNOSIS — H43813 Vitreous degeneration, bilateral: Secondary | ICD-10-CM | POA: Diagnosis not present

## 2014-04-15 DIAGNOSIS — Z0000 Encounter for general adult medical examination without abnormal findings: Secondary | ICD-10-CM | POA: Diagnosis not present

## 2014-06-21 ENCOUNTER — Encounter (HOSPITAL_COMMUNITY): Payer: Self-pay | Admitting: Emergency Medicine

## 2014-06-21 DIAGNOSIS — B9689 Other specified bacterial agents as the cause of diseases classified elsewhere: Secondary | ICD-10-CM | POA: Diagnosis not present

## 2014-06-21 DIAGNOSIS — R030 Elevated blood-pressure reading, without diagnosis of hypertension: Secondary | ICD-10-CM | POA: Diagnosis not present

## 2014-06-21 DIAGNOSIS — Z79899 Other long term (current) drug therapy: Secondary | ICD-10-CM | POA: Insufficient documentation

## 2014-06-21 DIAGNOSIS — R110 Nausea: Secondary | ICD-10-CM | POA: Insufficient documentation

## 2014-06-21 DIAGNOSIS — N390 Urinary tract infection, site not specified: Secondary | ICD-10-CM | POA: Diagnosis not present

## 2014-06-21 DIAGNOSIS — R42 Dizziness and giddiness: Secondary | ICD-10-CM | POA: Insufficient documentation

## 2014-06-21 DIAGNOSIS — R51 Headache: Secondary | ICD-10-CM | POA: Insufficient documentation

## 2014-06-21 DIAGNOSIS — Z7982 Long term (current) use of aspirin: Secondary | ICD-10-CM | POA: Insufficient documentation

## 2014-06-21 LAB — BASIC METABOLIC PANEL
ANION GAP: 8 (ref 5–15)
BUN: 17 mg/dL (ref 6–23)
CO2: 25 mmol/L (ref 19–32)
Calcium: 9.6 mg/dL (ref 8.4–10.5)
Chloride: 105 mmol/L (ref 96–112)
Creatinine, Ser: 0.79 mg/dL (ref 0.50–1.10)
GFR, EST NON AFRICAN AMERICAN: 79 mL/min — AB (ref >90)
GLUCOSE: 111 mg/dL — AB (ref 70–99)
Potassium: 3.8 mmol/L (ref 3.5–5.1)
Sodium: 138 mmol/L (ref 135–145)

## 2014-06-21 LAB — CBC
HCT: 39 % (ref 36.0–46.0)
Hemoglobin: 13.3 g/dL (ref 12.0–15.0)
MCH: 28.5 pg (ref 26.0–34.0)
MCHC: 34.1 g/dL (ref 30.0–36.0)
MCV: 83.7 fL (ref 78.0–100.0)
Platelets: 169 10*3/uL (ref 150–400)
RBC: 4.66 MIL/uL (ref 3.87–5.11)
RDW: 14.4 % (ref 11.5–15.5)
WBC: 3.6 10*3/uL — ABNORMAL LOW (ref 4.0–10.5)

## 2014-06-21 NOTE — ED Notes (Signed)
Pt reports feeling "swimmy headed" at home so she checked her BP- found BP to be 170 systolic- denies hx of hypertension.  Pt states "I just don't feel right".  Neuro exam normal, pt denies any pain or headache.  No acute distress noted.

## 2014-06-22 ENCOUNTER — Other Ambulatory Visit: Payer: Self-pay

## 2014-06-22 ENCOUNTER — Encounter (HOSPITAL_COMMUNITY): Payer: Self-pay

## 2014-06-22 ENCOUNTER — Emergency Department (HOSPITAL_COMMUNITY): Payer: Medicare Other

## 2014-06-22 ENCOUNTER — Emergency Department (HOSPITAL_COMMUNITY)
Admission: EM | Admit: 2014-06-22 | Discharge: 2014-06-22 | Disposition: A | Discharge status: Home / Self Care | Payer: Medicare Other | Attending: Emergency Medicine | Admitting: Emergency Medicine

## 2014-06-22 DIAGNOSIS — R030 Elevated blood-pressure reading, without diagnosis of hypertension: Secondary | ICD-10-CM

## 2014-06-22 DIAGNOSIS — R42 Dizziness and giddiness: Secondary | ICD-10-CM

## 2014-06-22 DIAGNOSIS — N390 Urinary tract infection, site not specified: Secondary | ICD-10-CM

## 2014-06-22 DIAGNOSIS — IMO0001 Reserved for inherently not codable concepts without codable children: Secondary | ICD-10-CM

## 2014-06-22 DIAGNOSIS — R51 Headache: Secondary | ICD-10-CM | POA: Diagnosis not present

## 2014-06-22 LAB — URINE MICROSCOPIC-ADD ON

## 2014-06-22 LAB — URINALYSIS, ROUTINE W REFLEX MICROSCOPIC
BILIRUBIN URINE: NEGATIVE
GLUCOSE, UA: NEGATIVE mg/dL
HGB URINE DIPSTICK: NEGATIVE
KETONES UR: NEGATIVE mg/dL
Nitrite: NEGATIVE
PH: 6 (ref 5.0–8.0)
PROTEIN: NEGATIVE mg/dL
Specific Gravity, Urine: 1.012 (ref 1.005–1.030)
Urobilinogen, UA: 0.2 mg/dL (ref 0.0–1.0)

## 2014-06-22 MED ORDER — MECLIZINE HCL 50 MG PO TABS: 25.0000 mg | ORAL_TABLET | Freq: Three times a day (TID) | ORAL | Status: DC | PRN

## 2014-06-22 MED ORDER — CEPHALEXIN 250 MG PO CAPS: 250.0000 mg | ORAL_CAPSULE | Freq: Two times a day (BID) | ORAL | Status: DC

## 2014-06-22 NOTE — Discharge Instructions (Signed)
Follow-up with ENT for persistent symptoms. If your symptoms worsen, you are unable to walk, you have persistent vomiting or focal weakness or any concerns return to the emergency department.   Vertigo Vertigo means you feel like you or your surroundings are moving when they are not. Vertigo can be dangerous if it occurs when you are at work, driving, or performing difficult activities.  CAUSES  Vertigo occurs when there is a conflict of signals sent to your brain from the visual and sensory systems in your body. There are many different causes of vertigo, including:  Infections, especially in the inner ear.  A bad reaction to a drug or misuse of alcohol and medicines.  Withdrawal from drugs or alcohol.  Rapidly changing positions, such as lying down or rolling over in bed.  A migraine headache.  Decreased blood flow to the brain.  Increased pressure in the brain from a head injury, infection, tumor, or bleeding. SYMPTOMS  You may feel as though the world is spinning around or you are falling to the ground. Because your balance is upset, vertigo can cause nausea and vomiting. You may have involuntary eye movements (nystagmus). DIAGNOSIS  Vertigo is usually diagnosed by physical exam. If the cause of your vertigo is unknown, your caregiver may perform imaging tests, such as an MRI scan (magnetic resonance imaging). TREATMENT  Most cases of vertigo resolve on their own, without treatment. Depending on the cause, your caregiver may prescribe certain medicines. If your vertigo is related to body position issues, your caregiver may recommend movements or procedures to correct the problem. In rare cases, if your vertigo is caused by certain inner ear problems, you may need surgery. HOME CARE INSTRUCTIONS   Follow your caregiver's instructions.  Avoid driving.  Avoid operating heavy machinery.  Avoid performing any tasks that would be dangerous to you or others during a vertigo  episode.  Tell your caregiver if you notice that certain medicines seem to be causing your vertigo. Some of the medicines used to treat vertigo episodes can actually make them worse in some people. SEEK IMMEDIATE MEDICAL CARE IF:   Your medicines do not relieve your vertigo or are making it worse.  You develop problems with talking, walking, weakness, or using your arms, hands, or legs.  You develop severe headaches.  Your nausea or vomiting continues or gets worse.  You develop visual changes.  A family member notices behavioral changes.  Your condition gets worse. MAKE SURE YOU:  Understand these instructions.  Will watch your condition.  Will get help right away if you are not doing well or get worse. Document Released: 01/24/2005 Document Revised: 07/09/2011 Document Reviewed: 11/02/2010 [facility] Patient Information 2015 [place], [place]. This information is not intended to replace advice given to you by your health care provider. Make sure you discuss any questions you have with your health care provider.

## 2014-06-22 NOTE — ED Provider Notes (Signed)
CSN: [id number]     Arrival date & time 06/21/14  2129 History  This chart was scribed for Loren Racer, MD by Abel Presto, ED Scribe. This patient was seen in room A02C/A02C and the patient's care was started at 2:49 AM.      Chief Complaint  Patient presents with  . Hypertension     Patient is a 74 y.o. female presenting with hypertension. The history is provided by the patient. No language interpreter was used.  Hypertension Associated symptoms include headaches. Pertinent negatives include no abdominal pain and no shortness of breath.   HPI Comments: [name] is a 74 y.o. female who presents to the Emergency Department complaining of intermittent dizziness today, described as spinning sensation. Pt notes she was drinking coffee at first onset. Pt notes mild headache associated nausea but states it was not during episode of dizziness. Pt states systolic BP was 159. Pt denies h/o HTN and CVA. Pt denies facial numbness, congestion, rhinorrhea, tinnitus, focal weakness or numbness, visual changes and vomiting.  History reviewed. No pertinent past medical history. Past Surgical History  Procedure Laterality Date  . Capsulotomy Right 07/25/2012    Procedure: MINOR PROCEDURE YAG LASER CAPSULOTOMY;  Surgeon: Vita Erm., MD;  Location: [facility] OR;  Service: Ophthalmology;  Laterality: Right;   No family history on file. History  Substance Use Topics  . Smoking status: Not on file  . Smokeless tobacco: Not on file  . Alcohol Use: Not on file   OB History    No data available     Review of Systems  Constitutional: Negative for fever and chills.  HENT: Negative for congestion, ear pain, hearing loss, rhinorrhea and tinnitus.   Eyes: Negative for photophobia and visual disturbance.  Respiratory: Negative for chest tightness and shortness of breath.   Gastrointestinal: Positive for nausea. Negative for vomiting, abdominal pain, diarrhea and constipation.   Genitourinary: Negative for dysuria, frequency, hematuria and flank pain.  Musculoskeletal: Negative for back pain, neck pain and neck stiffness.  Skin: Negative for rash and wound.  Neurological: Positive for dizziness and headaches. Negative for syncope, weakness and numbness.  All other systems reviewed and are negative.     Allergies  Review of patient's allergies indicates no known allergies.  Home Medications   Prior to Admission medications   Medication Sig Start Date End Date Taking? Authorizing Provider  aspirin EC 81 MG tablet Take 81 mg by mouth daily.   Yes Historical Provider, MD  Calcium Carbonate-Vitamin D (CALCIUM 600 + D PO) Take 1 tablet by mouth daily.   Yes Historical Provider, MD  Multiple Vitamins-Minerals (CENTRUM SILVER ADULT 50+ PO) Take 1 tablet by mouth daily.   Yes Historical Provider, MD  omega-3 acid ethyl esters (LOVAZA) 1 G capsule Take 1 g by mouth 4 (four) times daily.    Yes Historical Provider, MD  cephALEXin (KEFLEX) 250 MG capsule Take 1 capsule (250 mg total) by mouth 2 (two) times daily. 06/22/14   Loren Racer, MD  meclizine (ANTIVERT) 50 MG tablet Take 0.5 tablets (25 mg total) by mouth 3 (three) times daily as needed for dizziness. 06/22/14   Loren Racer, MD   BP 117/58 mmHg  Pulse 68  Temp(Src) 97.8 F (36.6 C) (Oral)  Resp 14  Ht  (1.6 m)  Wt 146 lb (66.225 kg)  BMI 25.87 kg/m2  SpO2 98% Physical Exam  Constitutional: She is oriented to person, place, and time. She appears well-developed and well-nourished.  No distress.  HENT:  Head: Normocephalic and atraumatic.  Right Ear: External ear normal.  Left Ear: External ear normal.  Mouth/Throat: Oropharynx is clear and moist.  Eyes: Conjunctivae and EOM are normal. Pupils are equal, round, and reactive to light.  No nystagmus  Neck: Normal range of motion. Neck supple.  No meningismus  Cardiovascular: Normal rate and regular rhythm.   Pulmonary/Chest: Effort normal and  breath sounds normal. No respiratory distress. She has no wheezes. She has no rales. She exhibits no tenderness.  Abdominal: Soft. Bowel sounds are normal. She exhibits no distension and no mass. There is no tenderness. There is no rebound and no guarding.  Musculoskeletal: Normal range of motion. She exhibits no edema or tenderness.  Normal strength  Neurological: She is alert and oriented to person, place, and time. No cranial nerve deficit. She exhibits normal muscle tone. Coordination normal.  Patient is alert and oriented x3 with clear, goal oriented speech. Patient has 5/5 motor in all extremities. Sensation is intact to light touch. Bilateral finger-to-nose is normal with no signs of dysmetria. Patient has a normal gait and walks without assistance.  Skin: Skin is warm and dry. No rash noted. No erythema.  Psychiatric: She has a normal mood and affect. Her behavior is normal.  Nursing note and vitals reviewed.   ED Course  Procedures (including critical care time) DIAGNOSTIC STUDIES: Oxygen Saturation is 98% on room air, normal by my interpretation.    COORDINATION OF CARE: 2:57 AM Discussed treatment plan with patient at beside, the patient agrees with the plan and has no further questions at this time.   Labs Review Labs Reviewed  CBC - Abnormal; Notable for the following:    WBC 3.6 (*)    All other components within normal limits  BASIC METABOLIC PANEL - Abnormal; Notable for the following:    Glucose, Bld 111 (*)    GFR calc non Af Amer 79 (*)    All other components within normal limits  URINALYSIS, ROUTINE W REFLEX MICROSCOPIC - Abnormal; Notable for the following:    APPearance CLOUDY (*)    Leukocytes, UA MODERATE (*)    All other components within normal limits  URINE MICROSCOPIC-ADD ON - Abnormal; Notable for the following:    Squamous Epithelial / LPF FEW (*)    Bacteria, UA FEW (*)    All other components within normal limits    Imaging Review Mr Brain Wo  Contrast  06/22/2014   CLINICAL DATA:  Intermittent dizziness today, vertigo, mild headache.  EXAM: MRI HEAD WITHOUT CONTRAST  TECHNIQUE: Multiplanar, multiecho pulse sequences of the brain and surrounding structures were obtained without intravenous contrast.  COMPARISON:  None.  FINDINGS: No reduced diffusion to suggest acute ischemia. No susceptibility artifact to suggest hemorrhage. The ventricles and sulci are normal for patient's age. 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 No midline shift, mass effect or mass lesions.  No abnormal extra-axial fluid collections. Normal major intracranial vascular flow voids seen at the skull base.  No paranasal sinus air-fluid levels. Mastoid air cells are well aerated. Status post RIGHT ocular lens implant. No abnormal sellar expansion. No cerebellar tonsillar ectopia. No abnormal bone marrow signal.  IMPRESSION: No acute intracranial process. Normal MRI of the brain with and without contrast for age.   Electronically Signed   By: Awilda Metroourtnay  Bloomer   On: 06/22/2014 05:54     EKG Interpretation   Date/Time:  Tuesday  June 22 2014 03:28:19 EST Ventricular Rate:  67 PR Interval:  153 QRS Duration: 93 QT Interval:  408 QTC Calculation: 431 R Axis:   35 Text Interpretation:  Pacemaker spikes or artifacts Sinus rhythm Consider  left atrial enlargement Borderline repolarization abnormality Confirmed by  Ranae Palms  MD, Murray Guzzetta ([id number]) on 06/22/2014 4:14:02 AM      MDM   Final diagnoses:  Vertigo  Elevated blood pressure  UTI (lower urinary tract infection)    I personally performed the services described in this documentation, which was scribed in my presence. The recorded information has been reviewed and is accurate.  Pt presents with vertiginous symptoms which have currently resolved. Given age, check MRI to rule out CVA. Suspect peripheral cause.  Patient ambulates without difficulty.  MRI without any acute findings.  ENT referral given. Return precautions given. We'll treat for UTI.  Loren Racer, MD 06/23/14 [phone]

## 2014-06-22 NOTE — ED Notes (Signed)
MD at bedside. 

## 2014-07-12 DIAGNOSIS — I10 Essential (primary) hypertension: Secondary | ICD-10-CM | POA: Diagnosis not present

## 2014-07-12 DIAGNOSIS — N189 Chronic kidney disease, unspecified: Secondary | ICD-10-CM | POA: Diagnosis not present

## 2014-07-12 DIAGNOSIS — G4730 Sleep apnea, unspecified: Secondary | ICD-10-CM | POA: Diagnosis not present

## 2014-11-09 DIAGNOSIS — R7309 Other abnormal glucose: Secondary | ICD-10-CM | POA: Diagnosis not present

## 2014-11-09 DIAGNOSIS — I10 Essential (primary) hypertension: Secondary | ICD-10-CM | POA: Diagnosis not present

## 2014-11-09 DIAGNOSIS — G4730 Sleep apnea, unspecified: Secondary | ICD-10-CM | POA: Diagnosis not present

## 2014-11-09 DIAGNOSIS — M130 Polyarthritis, unspecified: Secondary | ICD-10-CM | POA: Diagnosis not present

## 2014-11-23 DIAGNOSIS — Z1211 Encounter for screening for malignant neoplasm of colon: Secondary | ICD-10-CM | POA: Diagnosis not present

## 2014-11-23 DIAGNOSIS — K5900 Constipation, unspecified: Secondary | ICD-10-CM | POA: Diagnosis not present

## 2014-12-06 DIAGNOSIS — Z1211 Encounter for screening for malignant neoplasm of colon: Secondary | ICD-10-CM | POA: Diagnosis not present

## 2015-02-02 DIAGNOSIS — L900 Lichen sclerosus et atrophicus: Secondary | ICD-10-CM | POA: Diagnosis not present

## 2015-03-01 ENCOUNTER — Other Ambulatory Visit: Payer: Self-pay

## 2015-03-01 DIAGNOSIS — Z1231 Encounter for screening mammogram for malignant neoplasm of breast: Secondary | ICD-10-CM

## 2015-03-08 DIAGNOSIS — H43813 Vitreous degeneration, bilateral: Secondary | ICD-10-CM | POA: Diagnosis not present

## 2015-03-08 DIAGNOSIS — H2512 Age-related nuclear cataract, left eye: Secondary | ICD-10-CM | POA: Diagnosis not present

## 2015-03-09 DIAGNOSIS — M130 Polyarthritis, unspecified: Secondary | ICD-10-CM | POA: Diagnosis not present

## 2015-03-09 DIAGNOSIS — Z23 Encounter for immunization: Secondary | ICD-10-CM | POA: Diagnosis not present

## 2015-03-09 DIAGNOSIS — Z6331 Absence of family member due to military deployment: Secondary | ICD-10-CM | POA: Diagnosis not present

## 2015-03-09 DIAGNOSIS — I10 Essential (primary) hypertension: Secondary | ICD-10-CM | POA: Diagnosis not present

## 2015-03-16 ENCOUNTER — Ambulatory Visit
Admission: RE | Admit: 2015-03-16 | Discharge: 2015-03-16 | Disposition: A | Discharge status: Home / Self Care | Payer: Medicare Other | Source: Ambulatory Visit

## 2015-03-16 DIAGNOSIS — Z1231 Encounter for screening mammogram for malignant neoplasm of breast: Secondary | ICD-10-CM

## 2015-04-20 ENCOUNTER — Other Ambulatory Visit: Payer: Self-pay | Admitting: Ophthalmology

## 2015-04-26 ENCOUNTER — Encounter (HOSPITAL_COMMUNITY): Payer: Self-pay | Admitting: *Deleted

## 2015-04-26 NOTE — H&P (Signed)
History & Physical:   DATE:   03-28-15  NAME:  [name], [name]     [id number]       HISTORY OF PRESENT ILLNESS: Dr. Mitzi Davenport former pt.    Chief Eye Complaints   Patient c/o OU still watering at night., but  patient  states she has not been consistent with taking her gtts. Va seems to be pretty good out of OD, poor OS. Patient c/o having pain in OU every once in awhile.  patient  states that she thinks she needs a new frame for her glasses.  HPI: EYES: Reports symptoms of  problems with eyeglasses      LOCATION:   LEFT EYES        QUALITY/COURSE:   Reports condition is WORSE   INTENSITY/SEVERITY:    Reports measurement ( or degree) as   DURATION:   Reports the general length of symptoms to be   ONSET/TIMING:            ACTIVE PROBLEMS: Vitreous degeneration, bilateral   ICD10: H43.813  ICD9:   Onset: 03/04/2014 10:16  Initial Date:    Dry eye syndrome of bilateral lacrimal glands   ICD10: H04.123  ICD9:    Pseudophakia   ICD10: Z96.1 ICD9: 446.0 Onset: 03/04/2014 09:03 Initial Date:    MA lens OD  Age-related nuclear cataract, left eye   ICD10: H25.12  Initial Date:   ICD9:   Poliosis  SURGERIES: CE w IOL OD 2010 Yag Laser OD  Cyst Removal Pick List - Surgeries  MEDICATIONS: omega3   Flax seed  Aspirin:  81 mg tablet  SIG-  1 each   once a day   Lovaza: Strength-  SIG-    Zetia: Strength-  SIG-    Ciloxan (Ciprofloxacin) Solution: 0.3% solution SIG-  1 drop in OS BID   Ilevro: 0.3% suspension SIG-  1 drop in OS QD   Durezol: 0.05% emulsion SIG-  1 drop QID OS  REVIEW OF SYSTEMS: ROS:   GEN- Constitutional: Negative general-constitutional systems review.      HENT: GEN - Endocrine: Reports symptoms of LUNGS/Respiratory:  HEART/Cardiovascular: Reports symptoms of ABD/Gastrointestinal:   Musculoskeletal (BJE): NEURO/Neurological: PSYCH/Psychiatric:    Is the pt oriented to time, place, person? yes Mood normal     TOBACCO: Smoker  Status:   Former Smoker.     SOCIAL HISTORY: Retired Midwife - Social History  FAMILY HISTORY: Positive family history for  -   Negative family history for  -   PARENTS: CHILDREN: GRANDPARENTS: SIBLINGS: UNCLES/AUNTS: OTHERS/DISTANT:    Family History - 1st Degree Relatives:  Daughter alive and well.  ALLERGIES: Drug Allergies.  No Known.   Starter - Allergies - Summary:  PHYSICAL EXAMINATION: VS: BMI: 38.9.  BP: 133/67.  H: 53.00 in.  P: 70 /min.  W: 155lbs 0oz.    Va 03/08/2015 09:12     OD cc 20/20 OS cc 20/30+1  EYEGLASSES: 2014 OD: -0.75 +1.00 x019 OS: -1.25 +1.25 x004 ADD: +2.75  MR 03/08/2015 09:25  OD: -1.25 +1.00 x017 20/25 OS: -2.25 +1.75 x169 20/30 ADD: +2.75  K's11/11/2014 10:30  OD: 42.25 42.75 OS: 42.25 43.00  VF:   OD full in all four quadrants OS full in all four quadrants  Motility orthophoria and full  PUPILS: 4 mm round reactive negative [name]  EYELIDS & OCULAR  ADNEXA infraorbital hypopigmentation each eye  SLE: Conjunctiva quiet each eye  Cornea arcus with decrease tear film each eye   anterior chamber  deep and quiet each eye  Iris Brown each eye, no vessels , iris strand OS   Lens Posterior chamber intraocular lens implant OD, +2-3 nuclear sclerosis OS  Vitreous posterior vitreous detachment   OU  CCT  Ta   in mmHg    OD  14        OS 14 Time  03/08/2015 10:28   Gonio   Dilation trop  phenylephrine 2.5% 03/08/2015 10:30    Fundus:  optic nerve  OD    30% cup pink color                                                                 OS 30% cup pink color   Macula      OD     clear                                               OS clear  Vessels normal  Periphery normal     Exam: GENERAL: Appearance: HEAD, EARS, NOSE AND THROAT: Ears-Nose (external) Inspection: Externally, nose and ears are normal in appearance and without scars, lesions, or nodules.      Hearing  assessment shows no problems with normal conversation.      LUNGS and RESPIRATORY: Lung auscultation elicits no wheezing, rhonci, rales or rubs and with equal breath sounds.    Respiratory effort described as breathing is unlabored and chest movement is symmetrical.    HEART (Cardiovascular): Heart auscultation discovers regular rate and rhythm; no murmur, gallop or rub. Normal heart sounds.    ABDOMEN (Gastrointestinal): Mass/Tenderness Exam: Neither are present.     MUSCULOSKELETAL (BJE): Inspection-Palpation: No major bone, joint, tendon, or muscle changes.      NEUROLOGICAL: Alert and oriented. No major deficits of coordination or sensation.      PSYCHIATRIC: Insight and judgment appear  both to be intact and appropriate.    Mood and affect are described as normal mood and full affect.    SKIN: Skin Inspection: No rashes or lesions  ADMITTING DIAGNOSIS: Age-related nuclear cataract, left eye   ICD10: H25.12   Poliosis Vitreous degeneration, bilateral   ICD10: H43.813  ICD9:   Onset: 03/04/2014 10:16  Initial Date:    Dry eye syndrome of bilateral lacrimal glands   ICD10: H04.123    Pseudophakia   ICD10: Z96.1 ICD9: 446.0 Onset: 03/04/2014 09:03   MA lens OD   SURGICAL TREATMENT PLAN: phaco emulsion cataract extraction  with  intraocular lens implant  OS   Risk and benefits of surgery have been reviewed with the patient and the patient agrees to proceed with the surgical procedure.    patient  stated around christmas    Patient instructed to use artificial tears at least twice a day every day.  Actions:     Handouts: New Handout, What is a cataract?.    ___________________________ Chalmers Guestoy Charlynn Salih, Montez HagemanJr. Starter - Inactive Problems:

## 2015-04-26 NOTE — Anesthesia Preprocedure Evaluation (Signed)
Anesthesia Evaluation  Patient identified by MRN, date of birth, ID band Patient awake    Reviewed: Allergy & Precautions, NPO status , Patient's Chart, lab work & pertinent test results  Airway Mallampati: II  TM Distance: >3 FB Neck ROM: Full    Dental no notable dental hx.    Pulmonary neg pulmonary ROS, former smoker,    Pulmonary exam normal breath sounds clear to auscultation       Cardiovascular negative cardio ROS Normal cardiovascular exam Rhythm:Regular Rate:Normal     Neuro/Psych negative neurological ROS  negative psych ROS   GI/Hepatic negative GI ROS, Neg liver ROS,   Endo/Other  negative endocrine ROS  Renal/GU negative Renal ROS     Musculoskeletal negative musculoskeletal ROS (+)   Abdominal   Peds  Hematology negative hematology ROS (+)   Anesthesia Other Findings   Reproductive/Obstetrics negative OB ROS                             Anesthesia Physical Anesthesia Plan  ASA: II  Anesthesia Plan: MAC   Post-op Pain Management:    Induction: Intravenous  Airway Management Planned:   Additional Equipment:   Intra-op Plan:   Post-operative Plan:   Informed Consent: I have reviewed the patients History and Physical, chart, labs and discussed the procedure including the risks, benefits and alternatives for the proposed anesthesia with the patient or authorized representative who has indicated his/her understanding and acceptance.   Dental advisory given  Plan Discussed with: CRNA  Anesthesia Plan Comments:         Anesthesia Quick Evaluation

## 2015-04-27 ENCOUNTER — Ambulatory Visit (HOSPITAL_COMMUNITY)
Admission: RE | Admit: 2015-04-27 | Discharge: 2015-04-27 | Disposition: A | Discharge status: Home / Self Care | Payer: Medicare Other | Source: Ambulatory Visit | Attending: Ophthalmology | Admitting: Ophthalmology

## 2015-04-27 ENCOUNTER — Encounter (HOSPITAL_COMMUNITY): Payer: Self-pay | Admitting: General Practice

## 2015-04-27 ENCOUNTER — Encounter (HOSPITAL_COMMUNITY)
Admission: RE | Disposition: A | Discharge status: Home / Self Care | Payer: Self-pay | Source: Ambulatory Visit | Attending: Ophthalmology

## 2015-04-27 ENCOUNTER — Ambulatory Visit (HOSPITAL_COMMUNITY): Payer: Medicare Other | Admitting: Certified Registered Nurse Anesthetist

## 2015-04-27 DIAGNOSIS — H25812 Combined forms of age-related cataract, left eye: Secondary | ICD-10-CM | POA: Diagnosis not present

## 2015-04-27 DIAGNOSIS — Z961 Presence of intraocular lens: Secondary | ICD-10-CM | POA: Insufficient documentation

## 2015-04-27 DIAGNOSIS — H43813 Vitreous degeneration, bilateral: Secondary | ICD-10-CM | POA: Diagnosis not present

## 2015-04-27 DIAGNOSIS — Z87891 Personal history of nicotine dependence: Secondary | ICD-10-CM | POA: Diagnosis not present

## 2015-04-27 DIAGNOSIS — Z79899 Other long term (current) drug therapy: Secondary | ICD-10-CM | POA: Insufficient documentation

## 2015-04-27 DIAGNOSIS — L671 Variations in hair color: Secondary | ICD-10-CM | POA: Diagnosis not present

## 2015-04-27 DIAGNOSIS — H04123 Dry eye syndrome of bilateral lacrimal glands: Secondary | ICD-10-CM | POA: Diagnosis not present

## 2015-04-27 DIAGNOSIS — Z7982 Long term (current) use of aspirin: Secondary | ICD-10-CM | POA: Insufficient documentation

## 2015-04-27 DIAGNOSIS — H269 Unspecified cataract: Secondary | ICD-10-CM | POA: Diagnosis present

## 2015-04-27 DIAGNOSIS — H2512 Age-related nuclear cataract, left eye: Secondary | ICD-10-CM | POA: Insufficient documentation

## 2015-04-27 HISTORY — PX: CATARACT EXTRACTION W/PHACO: SHX586

## 2015-04-27 LAB — BASIC METABOLIC PANEL
ANION GAP: 8 (ref 5–15)
BUN: 13 mg/dL (ref 6–20)
CHLORIDE: 107 mmol/L (ref 101–111)
CO2: 27 mmol/L (ref 22–32)
Calcium: 9.4 mg/dL (ref 8.9–10.3)
Creatinine, Ser: 1.02 mg/dL — ABNORMAL HIGH (ref 0.44–1.00)
GFR calc Af Amer: >60 mL/min (ref >60)
GFR, EST NON AFRICAN AMERICAN: 52 mL/min — AB (ref >60)
GLUCOSE: 120 mg/dL — AB (ref 65–99)
POTASSIUM: 3.9 mmol/L (ref 3.5–5.1)
SODIUM: 142 mmol/L (ref 135–145)

## 2015-04-27 LAB — CBC
HEMATOCRIT: 37.8 % (ref 36.0–46.0)
HEMOGLOBIN: 12.5 g/dL (ref 12.0–15.0)
MCH: 28.5 pg (ref 26.0–34.0)
MCHC: 33.1 g/dL (ref 30.0–36.0)
MCV: 86.3 fL (ref 78.0–100.0)
Platelets: 174 10*3/uL (ref 150–400)
RBC: 4.38 MIL/uL (ref 3.87–5.11)
RDW: 15.3 % (ref 11.5–15.5)
WBC: 3.4 10*3/uL — AB (ref 4.0–10.5)

## 2015-04-27 SURGERY — PHACOEMULSIFICATION, CATARACT, WITH IOL INSERTION
Anesthesia: Monitor Anesthesia Care | Site: Eye | Laterality: Left

## 2015-04-27 MED ORDER — HYALURONIDASE HUMAN 150 UNIT/ML IJ SOLN
INTRAMUSCULAR | Status: AC
  Filled 2015-04-27: qty 1

## 2015-04-27 MED ORDER — TROPICAMIDE 1 % OP SOLN
1.0000 [drp] | OPHTHALMIC | Status: AC
  Administered 2015-04-27 (×3): 1 [drp] via OPHTHALMIC

## 2015-04-27 MED ORDER — PHENYLEPHRINE HCL 2.5 % OP SOLN
1.0000 [drp] | OPHTHALMIC | Status: AC
  Administered 2015-04-27 (×3): 1 [drp] via OPHTHALMIC

## 2015-04-27 MED ORDER — TROPICAMIDE 1 % OP SOLN
1.0000 [drp] | OPHTHALMIC | Status: DC | PRN
Start: 2015-04-27 — End: 2015-04-27
  Filled 2015-04-27: qty 3

## 2015-04-27 MED ORDER — PHENYLEPHRINE HCL 2.5 % OP SOLN
1.0000 [drp] | OPHTHALMIC | Status: DC | PRN
  Filled 2015-04-27: qty 2

## 2015-04-27 MED ORDER — ONDANSETRON HCL 4 MG/2ML IJ SOLN
INTRAMUSCULAR | Status: AC
  Filled 2015-04-27: qty 2

## 2015-04-27 MED ORDER — NA CHONDROIT SULF-NA HYALURON 40-30 MG/ML IO SOLN
INTRAOCULAR | Status: AC
  Filled 2015-04-27: qty 0.5

## 2015-04-27 MED ORDER — GENTAMICIN SULFATE 40 MG/ML IJ SOLN
INTRAMUSCULAR | Status: AC
  Filled 2015-04-27: qty 2

## 2015-04-27 MED ORDER — EPINEPHRINE HCL 1 MG/ML IJ SOLN
INTRAOCULAR | Status: DC | PRN
  Administered 2015-04-27: 08:00:00

## 2015-04-27 MED ORDER — 0.9 % SODIUM CHLORIDE (POUR BTL) OPTIME
TOPICAL | Status: DC | PRN
  Administered 2015-04-27: 200 mL

## 2015-04-27 MED ORDER — LIDOCAINE HCL (CARDIAC) 20 MG/ML IV SOLN
INTRAVENOUS | Status: AC
  Filled 2015-04-27: qty 5

## 2015-04-27 MED ORDER — SODIUM HYALURONATE 10 MG/ML IO SOLN
INTRAOCULAR | Status: DC | PRN
  Administered 2015-04-27: 0.85 mL via INTRAOCULAR

## 2015-04-27 MED ORDER — MIDAZOLAM HCL 5 MG/5ML IJ SOLN
INTRAMUSCULAR | Status: DC | PRN
  Administered 2015-04-27 (×2): 1 mg via INTRAVENOUS

## 2015-04-27 MED ORDER — EPINEPHRINE HCL 1 MG/ML IJ SOLN
INTRAMUSCULAR | Status: AC
  Filled 2015-04-27: qty 1

## 2015-04-27 MED ORDER — LIDOCAINE HCL 2 % IJ SOLN
INTRAMUSCULAR | Status: AC
  Filled 2015-04-27: qty 20

## 2015-04-27 MED ORDER — TETRACAINE HCL 0.5 % OP SOLN
OPHTHALMIC | Status: AC
  Filled 2015-04-27: qty 2

## 2015-04-27 MED ORDER — LIDOCAINE-EPINEPHRINE 2 %-1:100000 IJ SOLN
INTRAMUSCULAR | Status: AC
  Filled 2015-04-27: qty 1

## 2015-04-27 MED ORDER — FENTANYL CITRATE (PF) 100 MCG/2ML IJ SOLN
INTRAMUSCULAR | Status: DC | PRN
  Administered 2015-04-27 (×2): 25 ug via INTRAVENOUS

## 2015-04-27 MED ORDER — BSS IO SOLN
INTRAOCULAR | Status: AC
  Filled 2015-04-27: qty 15

## 2015-04-27 MED ORDER — CYCLOPENTOLATE HCL 1 % OP SOLN
1.0000 [drp] | OPHTHALMIC | Status: AC
  Administered 2015-04-27 (×3): 1 [drp] via OPHTHALMIC

## 2015-04-27 MED ORDER — PROPOFOL 10 MG/ML IV BOLUS
INTRAVENOUS | Status: AC
  Filled 2015-04-27: qty 20

## 2015-04-27 MED ORDER — STERILE WATER FOR IRRIGATION IR SOLN
Status: DC | PRN
  Administered 2015-04-27: 200 mL

## 2015-04-27 MED ORDER — ROCURONIUM BROMIDE 50 MG/5ML IV SOLN
INTRAVENOUS | Status: AC
  Filled 2015-04-27: qty 1

## 2015-04-27 MED ORDER — NA CHONDROIT SULF-NA HYALURON 40-30 MG/ML IO SOLN
INTRAOCULAR | Status: DC | PRN
  Administered 2015-04-27: 0.5 mL via INTRAOCULAR

## 2015-04-27 MED ORDER — SODIUM HYALURONATE 10 MG/ML IO SOLN
INTRAOCULAR | Status: AC
  Filled 2015-04-27: qty 0.85

## 2015-04-27 MED ORDER — TOBRAMYCIN 0.3 % OP OINT
TOPICAL_OINTMENT | OPHTHALMIC | Status: DC | PRN
  Administered 2015-04-27: 1 via OPHTHALMIC

## 2015-04-27 MED ORDER — KETOROLAC TROMETHAMINE 0.5 % OP SOLN
1.0000 [drp] | OPHTHALMIC | Status: AC
  Administered 2015-04-27 (×3): 1 [drp] via OPHTHALMIC

## 2015-04-27 MED ORDER — PHENYLEPHRINE 40 MCG/ML (10ML) SYRINGE FOR IV PUSH (FOR BLOOD PRESSURE SUPPORT)
PREFILLED_SYRINGE | INTRAVENOUS | Status: AC
  Filled 2015-04-27: qty 10

## 2015-04-27 MED ORDER — FENTANYL CITRATE (PF) 250 MCG/5ML IJ SOLN
INTRAMUSCULAR | Status: AC
  Filled 2015-04-27: qty 5

## 2015-04-27 MED ORDER — PROPOFOL 10 MG/ML IV BOLUS
INTRAVENOUS | Status: DC | PRN
  Administered 2015-04-27 (×3): 20 mg via INTRAVENOUS

## 2015-04-27 MED ORDER — BSS IO SOLN
INTRAOCULAR | Status: AC
Start: 2015-04-27 — End: 2015-04-27
  Filled 2015-04-27: qty 500

## 2015-04-27 MED ORDER — MIDAZOLAM HCL 2 MG/2ML IJ SOLN
INTRAMUSCULAR | Status: AC
  Filled 2015-04-27: qty 2

## 2015-04-27 MED ORDER — ACETYLCHOLINE CHLORIDE 20 MG IO SOLR
INTRAOCULAR | Status: AC
  Filled 2015-04-27: qty 1

## 2015-04-27 MED ORDER — DEXAMETHASONE SODIUM PHOSPHATE 10 MG/ML IJ SOLN
INTRAMUSCULAR | Status: AC
  Filled 2015-04-27: qty 1

## 2015-04-27 MED ORDER — PROPOFOL 500 MG/50ML IV EMUL
INTRAVENOUS | Status: DC | PRN
  Administered 2015-04-27: 40 ug/kg/min via INTRAVENOUS

## 2015-04-27 MED ORDER — SUCCINYLCHOLINE CHLORIDE 20 MG/ML IJ SOLN
INTRAMUSCULAR | Status: AC
  Filled 2015-04-27: qty 1

## 2015-04-27 MED ORDER — GATIFLOXACIN 0.5 % OP SOLN
1.0000 [drp] | OPHTHALMIC | Status: AC
  Administered 2015-04-27 (×3): 1 [drp] via OPHTHALMIC

## 2015-04-27 MED ORDER — LACTATED RINGERS IV SOLN
INTRAVENOUS | Status: DC | PRN
  Administered 2015-04-27: 08:00:00 via INTRAVENOUS

## 2015-04-27 MED ORDER — LIDOCAINE-EPINEPHRINE 2 %-1:100000 IJ SOLN
INTRAMUSCULAR | Status: DC | PRN
  Administered 2015-04-27: 08:00:00 via RETROBULBAR

## 2015-04-27 MED ORDER — ACETYLCHOLINE CHLORIDE 20 MG IO SOLR
INTRAOCULAR | Status: DC | PRN
  Administered 2015-04-27: 20 mg via INTRAOCULAR

## 2015-04-27 MED ORDER — SODIUM CHLORIDE 0.9 % IJ SOLN
INTRAMUSCULAR | Status: AC
  Filled 2015-04-27: qty 10

## 2015-04-27 MED ORDER — CYCLOPENTOLATE HCL 1 % OP SOLN
1.0000 [drp] | OPHTHALMIC | Status: DC | PRN
  Filled 2015-04-27: qty 2

## 2015-04-27 MED ORDER — EPHEDRINE SULFATE 50 MG/ML IJ SOLN
INTRAMUSCULAR | Status: AC
  Filled 2015-04-27: qty 1

## 2015-04-27 MED ORDER — TETRACAINE HCL 0.5 % OP SOLN
1.0000 [drp] | OPHTHALMIC | Status: AC
  Administered 2015-04-27: 1 [drp] via OPHTHALMIC
  Filled 2015-04-27: qty 2

## 2015-04-27 MED ORDER — GATIFLOXACIN 0.5 % OP SOLN
1.0000 [drp] | OPHTHALMIC | Status: DC | PRN
  Filled 2015-04-27: qty 2.5

## 2015-04-27 MED ORDER — KETOROLAC TROMETHAMINE 0.5 % OP SOLN
1.0000 [drp] | OPHTHALMIC | Status: DC | PRN
  Filled 2015-04-27: qty 5

## 2015-04-27 MED ORDER — TOBRAMYCIN-DEXAMETHASONE 0.3-0.1 % OP OINT
TOPICAL_OINTMENT | OPHTHALMIC | Status: AC
  Filled 2015-04-27: qty 3.5

## 2015-04-27 MED ORDER — PILOCARPINE HCL 4 % OP SOLN
OPHTHALMIC | Status: AC
  Filled 2015-04-27: qty 15

## 2015-04-27 MED ORDER — BUPIVACAINE HCL (PF) 0.75 % IJ SOLN
INTRAMUSCULAR | Status: AC
  Filled 2015-04-27: qty 10

## 2015-04-27 MED ORDER — GLYCOPYRROLATE 0.2 MG/ML IJ SOLN
INTRAMUSCULAR | Status: AC
  Filled 2015-04-27: qty 1

## 2015-04-27 SURGICAL SUPPLY — 40 items
APL SRG 3 HI ABS STRL LF PLS (MISCELLANEOUS) ×1
APPLICATOR COTTON TIP 6IN STRL (MISCELLANEOUS) ×3 IMPLANT
APPLICATOR DR MATTHEWS STRL (MISCELLANEOUS) ×3 IMPLANT
BLADE KERATOME 2.75 (BLADE) ×2 IMPLANT
BLADE KERATOME 2.75MM (BLADE) ×1
CANNULA ANTERIOR CHAMBER 27GA (MISCELLANEOUS) ×3 IMPLANT
CORDS BIPOLAR (ELECTRODE) IMPLANT
COVER MAYO STAND STRL (DRAPES) ×3 IMPLANT
DRAPE OPHTHALMIC 40X48 W POUCH (DRAPES) ×3 IMPLANT
DRAPE RETRACTOR (MISCELLANEOUS) ×3 IMPLANT
GLOVE BIO SURGEON STRL SZ8 (GLOVE) ×3 IMPLANT
GLOVE BIOGEL PI IND STRL 7.0 (GLOVE) IMPLANT
GLOVE BIOGEL PI IND STRL 8 (GLOVE) ×1 IMPLANT
GLOVE BIOGEL PI INDICATOR 7.0 (GLOVE) ×2
GLOVE BIOGEL PI INDICATOR 8 (GLOVE) ×2
GLOVE SURG SS PI 7.0 STRL IVOR (GLOVE) ×3 IMPLANT
GOWN STRL REUS W/ TWL LRG LVL3 (GOWN DISPOSABLE) ×2 IMPLANT
GOWN STRL REUS W/TWL LRG LVL3 (GOWN DISPOSABLE) ×6
KIT BASIN OR (CUSTOM PROCEDURE TRAY) ×3 IMPLANT
KIT ROOM TURNOVER OR (KITS) ×3 IMPLANT
LENS IOL ACRSF IQ PC 20.5 (Intraocular Lens) IMPLANT
LENS IOL ACRYSOF IQ POST 20.5 (Intraocular Lens) ×3 IMPLANT
NDL 18GX1X1/2 (RX/OR ONLY) (NEEDLE) ×1 IMPLANT
NDL 25GX 5/8IN NON SAFETY (NEEDLE) ×1 IMPLANT
NDL FILTER BLUNT 18X1 1/2 (NEEDLE) ×1 IMPLANT
NEEDLE 18GX1X1/2 (RX/OR ONLY) (NEEDLE) ×3 IMPLANT
NEEDLE 25GX 5/8IN NON SAFETY (NEEDLE) ×6 IMPLANT
NEEDLE FILTER BLUNT 18X 1/2SAF (NEEDLE) ×2
NEEDLE FILTER BLUNT 18X1 1/2 (NEEDLE) ×1 IMPLANT
NS IRRIG 1000ML POUR BTL (IV SOLUTION) ×3 IMPLANT
PACK CATARACT CUSTOM (CUSTOM PROCEDURE TRAY) ×3 IMPLANT
PAD ARMBOARD 7.5X6 YLW CONV (MISCELLANEOUS) ×3 IMPLANT
PAK PIK CVS CATARACT (OPHTHALMIC) ×3 IMPLANT
SUT ETHILON 10 0 CS140 6 (SUTURE) ×2 IMPLANT
SUT SILK 6 0 G 6 (SUTURE) IMPLANT
SYR TB 1ML LUER SLIP (SYRINGE) ×3 IMPLANT
TIP ABS 45DEG FLARED 0.9MM (TIP) ×3 IMPLANT
TOWEL OR 17X26 10 PK STRL BLUE (TOWEL DISPOSABLE) ×1 IMPLANT
WATER STERILE IRR 1000ML POUR (IV SOLUTION) ×3 IMPLANT
WIPE INSTRUMENT VISIWIPE 73X73 (MISCELLANEOUS) ×1 IMPLANT

## 2015-04-27 NOTE — H&P (View-Only) (Signed)
                  History & Physical:   DATE:   03-28-15  NAME:  [name], [name]     [id number]       HISTORY OF PRESENT ILLNESS: Dr. Brewington former pt.    Chief Eye Complaints   Patient c/o OU still watering at night., but  patient  states she has not been consistent with taking her gtts. Va seems to be pretty good out of OD, poor OS. Patient c/o having pain in OU every once in awhile.  patient  states that she thinks she needs a new frame for her glasses.  HPI: EYES: Reports symptoms of  problems with eyeglasses      LOCATION:   LEFT EYES        QUALITY/COURSE:   Reports condition is WORSE   INTENSITY/SEVERITY:    Reports measurement ( or degree) as   DURATION:   Reports the general length of symptoms to be   ONSET/TIMING:            ACTIVE PROBLEMS: Vitreous degeneration, bilateral   ICD10: H43.813  ICD9:   Onset: 03/04/2014 10:16  Initial Date:    Dry eye syndrome of bilateral lacrimal glands   ICD10: H04.123  ICD9:    Pseudophakia   ICD10: Z96.1 ICD9: 446.0 Onset: 03/04/2014 09:03 Initial Date:    MA lens OD  Age-related nuclear cataract, left eye   ICD10: H25.12  Initial Date:   ICD9:   Poliosis  SURGERIES: CE w IOL OD 2010 Yag Laser OD  Cyst Removal Pick List - Surgeries  MEDICATIONS: omega3   Flax seed  Aspirin:  81 mg tablet  SIG-  1 each   once a day   Lovaza: Strength-  SIG-    Zetia: Strength-  SIG-    Ciloxan (Ciprofloxacin) Solution: 0.3% solution SIG-  1 drop in OS BID   Ilevro: 0.3% suspension SIG-  1 drop in OS QD   Durezol: 0.05% emulsion SIG-  1 drop QID OS  REVIEW OF SYSTEMS: ROS:   GEN- Constitutional: Negative general-constitutional systems review.      HENT: GEN - Endocrine: Reports symptoms of LUNGS/Respiratory:  HEART/Cardiovascular: Reports symptoms of ABD/Gastrointestinal:   Musculoskeletal (BJE): NEURO/Neurological: PSYCH/Psychiatric:    Is the pt oriented to time, place, person? yes Mood normal     TOBACCO: Smoker  Status:   Former Smoker.     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  ALLERGIES: Drug Allergies.  No Known.   Starter - Allergies - Summary:  PHYSICAL EXAMINATION: VS: BMI: 38.9.  BP: 133/67.  H: 53.00 in.  P: 70 /min.  W: 155lbs 0oz.    Va 03/08/2015 09:12     OD cc 20/20 OS cc 20/30+1  EYEGLASSES: 2014 OD: -0.75 +1.00 x019 OS: -1.25 +1.25 x004 ADD: +2.75  MR 03/08/2015 09:25  OD: -1.25 +1.00 x017 20/25 OS: -2.25 +1.75 x169 20/30 ADD: +2.75  K's11/11/2014 10:30  OD: 42.25 42.75 OS: 42.25 43.00  VF:   OD full in all four quadrants OS full in all four quadrants  Motility orthophoria and full  PUPILS: 4 mm round reactive negative Marcus Gunn  EYELIDS & OCULAR   ADNEXA infraorbital hypopigmentation each eye  SLE: Conjunctiva quiet each eye  Cornea arcus with decrease tear film each eye   anterior chamber  deep and quiet each eye  Iris Brown each eye, no vessels , iris strand OS   Lens Posterior chamber intraocular lens implant OD, +2-3 nuclear sclerosis OS  Vitreous posterior vitreous detachment   OU  CCT  Ta   in mmHg    OD  14        OS 14 Time  03/08/2015 10:28   Gonio   Dilation trop  phenylephrine 2.5% 03/08/2015 10:30    Fundus:  optic nerve  OD    30% cup pink color                                                                 OS 30% cup pink color   Macula      OD     clear                                               OS clear  Vessels normal  Periphery normal     Exam: GENERAL: Appearance: HEAD, EARS, NOSE AND THROAT: Ears-Nose (external) Inspection: Externally, nose and ears are normal in appearance and without scars, lesions, or nodules.      Hearing  assessment shows no problems with normal conversation.      LUNGS and RESPIRATORY: Lung auscultation elicits no wheezing, rhonci, rales or rubs and with equal breath sounds.    Respiratory effort described as breathing is unlabored and chest movement is symmetrical.    HEART (Cardiovascular): Heart auscultation discovers regular rate and rhythm; no murmur, gallop or rub. Normal heart sounds.    ABDOMEN (Gastrointestinal): Mass/Tenderness Exam: Neither are present.     MUSCULOSKELETAL (BJE): Inspection-Palpation: No major bone, joint, tendon, or muscle changes.      NEUROLOGICAL: Alert and oriented. No major deficits of coordination or sensation.      PSYCHIATRIC: Insight and judgment appear  both to be intact and appropriate.    Mood and affect are described as normal mood and full affect.    SKIN: Skin Inspection: No rashes or lesions  ADMITTING DIAGNOSIS: Age-related nuclear cataract, left eye   ICD10: H25.12   Poliosis Vitreous degeneration, bilateral   ICD10: H43.813  ICD9:   Onset: 03/04/2014 10:16  Initial Date:    Dry eye syndrome of bilateral lacrimal glands   ICD10: H04.123    Pseudophakia   ICD10: Z96.1 ICD9: 446.0 Onset: 03/04/2014 09:03   MA lens OD   SURGICAL TREATMENT PLAN: phaco emulsion cataract extraction  with  intraocular lens implant  OS   Risk and benefits of surgery have been reviewed with the patient and the patient agrees to proceed with the surgical procedure.    patient  stated around christmas    Patient instructed to use artificial tears at least twice a day every day.  Actions:     Handouts: New Handout, What is a cataract?.    ___________________________ Chalmers Guestoy Latrese Carolan, Montez HagemanJr. Starter - Inactive Problems:

## 2015-04-27 NOTE — Discharge Instructions (Signed)
The patient may remove the eye patch today at 2:00 PM. The patient should sleep with the plastic eye shield covering her eye the patient should sleep on her back or right side the patient should avoid heavy lifting bending or straining and do not mash or rub the eye.

## 2015-04-27 NOTE — Transfer of Care (Signed)
Immediate Anesthesia Transfer of Care Note  Patient: [name]  Procedure(s) Performed: Procedure(s): CATARACT EXTRACTION PHACO AND INTRAOCULAR LENS PLACEMENT (IOC) LEFT EYE (Left)  Patient Location: PACU  Anesthesia Type:MAC  Level of Consciousness: awake, alert , oriented and patient cooperative  Airway & Oxygen Therapy: Patient Spontanous Breathing and Patient connected to nasal cannula oxygen  Post-op Assessment: Report given to RN, Post -op Vital signs reviewed and stable and Patient moving all extremities X 4  Post vital signs: Reviewed and stable  Last Vitals:  Filed Vitals:   04/27/15 0646  BP: 163/76  Pulse: 72  Temp: 36.6 C  Resp: 18    Complications: No apparent anesthesia complications

## 2015-04-27 NOTE — Anesthesia Procedure Notes (Signed)
Procedure Name: MAC Date/Time: 04/27/2015 8:33 AM Performed by: Fabian NovemberSOLHEIM, [name] SALOMAN Pre-anesthesia Checklist: Patient identified, Timeout performed, Emergency Drugs available, Suction available and Patient being monitored Patient Re-evaluated:Patient Re-evaluated prior to inductionOxygen Delivery Method: Nasal cannula Number of attempts: 1 Dental Injury: Teeth and Oropharynx as per pre-operative assessment

## 2015-04-27 NOTE — Interval H&P Note (Signed)
History and Physical Interval Note:  04/27/2015 8:26 AM  [name]  has presented today for surgery, with the diagnosis of age related nuclear cataract left eye  The various methods of treatment have been discussed with the patient and family. After consideration of risks, benefits and other options for treatment, the patient has consented to  Procedure(s): CATARACT EXTRACTION PHACO AND INTRAOCULAR LENS PLACEMENT (IOC) LEFT EYE (Left) as a surgical intervention .  The patient's history has been reviewed, patient examined, no change in status, stable for surgery.  I have reviewed the patient's chart and labs.  Questions were answered to the patient's satisfaction.     Viyaan Champine

## 2015-04-27 NOTE — Op Note (Signed)
Preoperative diagnosis:  significant cataract left eye Postop diagnosis: Same Procedure: Phaco muscle patient intraocular lens implant left eye Complications: None Anesthesia: 2% Xylocaine with epinephrine in a 5050 mixture 0.75% Marcaine with an ampule Wydase Procedure: The patient was transported to the operating room where she was given a peribulbar block with the aforementioned local anesthetic agent. The patient's face was then prepped and draped in the usual sterile fashion. With the surgeon sitting temporally the operating microscope in position a Weck-Cel sponges used to fixate the globe and a 15 blade was used to enter through inferior clear cornea Viscoat was injected to inflate the anterior chamber with an additional Weck-Cel sponge in place a 2.75 mm keratome blade was used in a stepwise fashion through temporal clear cornea to into the chamber. Using a bent 25-gauge needle a continuous tear curvilinear capsulorrhexis was formed the capsule forceps were used to remove the anterior capsule.  BSS was used to hydrodissect the hydrodelineate the nucleus the nucleus was noted to rotate and the capsular bag. Following this the phacoemulsification unit was then used to remove the epinucleus a deep central trough was sculpted using the snapper hook and the phacotip the nucleus was divided into 4 quadrants all nuclear fragments were removed there was a remaining epinucleus that was hydrodissected using Viscoat. Then using the irrigation-aspiration device the epinucleus and cortical fibers were removed  The posterior capsule remained intact Provisc was injected.the intraocular lens implant was examined and noted to have no defects the lens was an Alcon AcrySof SN 60 WF IQ lens 20.5 dpt SN number [id number] the lens is placed in the lens injector and injected in the capsular bag it was position with a Kuglen hook. The irrigation aspiration handpiece was then used to remove viscoelastic from the eye. A  gonioscopy lens was placed on the eye to examine angled tissues. Following this the viscoelastic was again removed from the eye using the irrigation aspiration handpiece Miostat was injected the eye was pressurized a single 10-0 nylon suture was placed  There being no leakage all instruments were removed from the eye topical Tobrex ointment was applied to the eye. A patch and Fox shield were placed and the patient returned to recovery area stable condition. Chalmers Guestoy Finch Costanzo Junior M.D.

## 2015-04-27 NOTE — Anesthesia Postprocedure Evaluation (Signed)
Anesthesia Post Note  Patient: [name]  Procedure(s) Performed: Procedure(s) (LRB): CATARACT EXTRACTION PHACO AND INTRAOCULAR LENS PLACEMENT (IOC) LEFT EYE (Left)  Patient location during evaluation: PACU Anesthesia Type: General Level of consciousness: sedated and patient cooperative Pain management: pain level controlled Vital Signs Assessment: post-procedure vital signs reviewed and stable Respiratory status: spontaneous breathing Cardiovascular status: stable Anesthetic complications: no    Last Vitals:  Filed Vitals:   04/27/15 1020 04/27/15 1036  BP: 113/57 104/66  Pulse: 60 63  Temp: 36.6 C   Resp: 16 18    Last Pain: There were no vitals filed for this visit.               Lewie LoronJohn Cisco Kindt

## 2015-04-28 ENCOUNTER — Encounter (HOSPITAL_COMMUNITY): Payer: Self-pay | Admitting: Ophthalmology

## 2015-06-08 DIAGNOSIS — L900 Lichen sclerosus et atrophicus: Secondary | ICD-10-CM | POA: Diagnosis not present

## 2015-06-08 DIAGNOSIS — Z23 Encounter for immunization: Secondary | ICD-10-CM | POA: Diagnosis not present

## 2015-08-31 DIAGNOSIS — I10 Essential (primary) hypertension: Secondary | ICD-10-CM | POA: Diagnosis not present

## 2015-08-31 DIAGNOSIS — G5791 Unspecified mononeuropathy of right lower limb: Secondary | ICD-10-CM | POA: Diagnosis not present

## 2015-08-31 DIAGNOSIS — G4730 Sleep apnea, unspecified: Secondary | ICD-10-CM | POA: Diagnosis not present

## 2015-08-31 DIAGNOSIS — Z6832 Body mass index (BMI) 32.0-32.9, adult: Secondary | ICD-10-CM | POA: Diagnosis not present

## 2015-08-31 DIAGNOSIS — M130 Polyarthritis, unspecified: Secondary | ICD-10-CM | POA: Diagnosis not present

## 2015-08-31 DIAGNOSIS — Z130 Encounter for screening for diseases of the blood and blood-forming organs and certain disorders involving the immune mechanism: Secondary | ICD-10-CM | POA: Diagnosis not present

## 2015-08-31 DIAGNOSIS — E782 Mixed hyperlipidemia: Secondary | ICD-10-CM | POA: Diagnosis not present

## 2015-08-31 DIAGNOSIS — F4320 Adjustment disorder, unspecified: Secondary | ICD-10-CM | POA: Diagnosis not present

## 2015-08-31 DIAGNOSIS — N189 Chronic kidney disease, unspecified: Secondary | ICD-10-CM | POA: Diagnosis not present

## 2015-10-05 DIAGNOSIS — M130 Polyarthritis, unspecified: Secondary | ICD-10-CM | POA: Diagnosis not present

## 2015-10-05 DIAGNOSIS — I10 Essential (primary) hypertension: Secondary | ICD-10-CM | POA: Diagnosis not present

## 2015-10-05 DIAGNOSIS — G4730 Sleep apnea, unspecified: Secondary | ICD-10-CM | POA: Diagnosis not present

## 2015-10-05 DIAGNOSIS — E782 Mixed hyperlipidemia: Secondary | ICD-10-CM | POA: Diagnosis not present

## 2015-11-16 DIAGNOSIS — I83813 Varicose veins of bilateral lower extremities with pain: Secondary | ICD-10-CM | POA: Diagnosis not present

## 2015-12-07 ENCOUNTER — Encounter (HOSPITAL_COMMUNITY): Payer: Self-pay | Admitting: *Deleted

## 2015-12-07 ENCOUNTER — Ambulatory Visit (HOSPITAL_COMMUNITY)
Admission: EM | Admit: 2015-12-07 | Discharge: 2015-12-07 | Disposition: A | Discharge status: Home / Self Care | Payer: Medicare Other | Attending: Family Medicine | Admitting: Family Medicine

## 2015-12-07 DIAGNOSIS — W010XXA Fall on same level from slipping, tripping and stumbling without subsequent striking against object, initial encounter: Secondary | ICD-10-CM

## 2015-12-07 DIAGNOSIS — Z23 Encounter for immunization: Secondary | ICD-10-CM

## 2015-12-07 DIAGNOSIS — S50311A Abrasion of right elbow, initial encounter: Secondary | ICD-10-CM | POA: Diagnosis not present

## 2015-12-07 DIAGNOSIS — S0181XA Laceration without foreign body of other part of head, initial encounter: Secondary | ICD-10-CM

## 2015-12-07 MED ORDER — TETANUS-DIPHTH-ACELL PERTUSSIS 5-2.5-18.5 LF-MCG/0.5 IM SUSP
0.5000 mL | Freq: Once | INTRAMUSCULAR | Status: AC
  Administered 2015-12-07: 0.5 mL via INTRAMUSCULAR

## 2015-12-07 MED ORDER — TETANUS-DIPHTH-ACELL PERTUSSIS 5-2.5-18.5 LF-MCG/0.5 IM SUSP
INTRAMUSCULAR | Status: AC
  Filled 2015-12-07: qty 0.5

## 2015-12-07 NOTE — ED Triage Notes (Signed)
Pt  [name]     Today      She   Was  Running  To  Escape  A  Falling  Tree  Limb     And    Sustained  An  Abrasion to  Her  r  Elbow    She  Has  A  Superficial  Lac  To  Forehead     She  Is  Awake  And  Alert  And  Oriented     She  Has  Not  Vomited      Bleeding  Has  Subsided

## 2015-12-07 NOTE — ED Provider Notes (Signed)
CSN: [id number]     Arrival date & time 12/07/15  1154 History   First MD Initiated Contact with Patient 12/07/15 1228     Chief Complaint  Patient presents with  . Fall   (Consider location/radiation/quality/duration/timing/severity/associated sxs/prior Treatment) 75 year old female states she got up quickly and attempt to run to keep from being hit by a fallen tree limb. As she began to run she tripped and fell on cement striking her forehead and her right elbow on the cement. She received a vertical superficial dermal laceration to the mid forehead as well as an abrasion to the right elbow. There is a 1 mm superficial abrasion to the right hand. Denies other head injury, neck injury or pain, chest, back, abdomen or other extremity injury. She is fully awake, alert and oriented and recalls all of the events of the incident which occurred approximately 30 minutes prior to arrival. Her speech is lucid. No problems with vision, speech, hearing, swallowing, focal paresthesias or weakness.  Uncertain as to last tetanus.      History reviewed. No pertinent past medical history. Past Surgical History:  Procedure Laterality Date  . CAPSULOTOMY Right 07/25/2012   Procedure: MINOR PROCEDURE YAG LASER CAPSULOTOMY;  Surgeon: Vita Erm., MD;  Location: [facility] OR;  Service: Ophthalmology;  Laterality: Right;  . CATARACT EXTRACTION W/PHACO Left 04/27/2015   Procedure: CATARACT EXTRACTION PHACO AND INTRAOCULAR LENS PLACEMENT (IOC) LEFT EYE;  Surgeon: Chalmers Guest, MD;  Location: [facility] OR;  Service: Ophthalmology;  Laterality: Left;  . COLONOSCOPY    . EYE SURGERY Right    Caract   History reviewed. No pertinent family history. Social History  Substance Use Topics  . Smoking status: Former Smoker    Years: 18.00  . Smokeless tobacco: Not on file     Comment: quit in 1995  . Alcohol use No   OB History    No data available     Review of Systems  Constitutional: Negative.   HENT:  Negative.   Eyes: Negative.   Respiratory: Negative.   Cardiovascular: Negative.   Gastrointestinal: Negative.   Musculoskeletal: Negative.   Skin: Positive for wound.  Neurological: Negative.  Negative for dizziness, tremors, seizures, syncope, facial asymmetry, speech difficulty, weakness, light-headedness, numbness and headaches.  Hematological: Does not bruise/bleed easily.  Psychiatric/Behavioral: Negative.   All other systems reviewed and are negative.   Allergies  Review of patient's allergies indicates no known allergies.  Home Medications   Prior to Admission medications   Medication Sig Start Date End Date Taking? Authorizing Provider  aspirin EC 81 MG tablet Take 81 mg by mouth daily.    Historical Provider, MD  Calcium Carbonate-Vitamin D (CALCIUM 600 + D PO) Take 1 tablet by mouth daily.    Historical Provider, MD  CILOXAN 0.3 % ophthalmic ointment Place 1 drop into the left eye 2 (two) times daily.  04/04/15   Historical Provider, MD  clobetasol ointment (TEMOVATE) 0.05 % Apply 1 application topically daily as needed. 02/08/15   Historical Provider, MD  ILEVRO 0.3 % ophthalmic suspension Place 1 drop into the left eye daily. 04/04/15   Historical Provider, MD  Multiple Vitamins-Minerals (CENTRUM SILVER ADULT 50+ PO) Take 1 tablet by mouth daily.    Historical Provider, MD  omega-3 acid ethyl esters (LOVAZA) 1 G capsule Take 1 g by mouth 4 (four) times daily.     Historical Provider, MD   Meds Ordered and Administered this Visit   Medications  Tdap (BOOSTRIX)  injection 0.5 mL (not administered)    BP 127/68 (BP Location: Left Arm)   Pulse 89   Temp 98.1 F (36.7 C) (Oral)   Resp 16   SpO2 97%  No data found.   Physical Exam  Constitutional: She is oriented to person, place, and time. She appears well-developed and well-nourished. No distress.  HENT:  Head: Normocephalic.  Nose: Nose normal.  1.5 cm superficial dermal laceration to the mid forehead oriented  to the vertical. Minimal bleeding. No surrounding wounds.  Eyes: Conjunctivae and EOM are normal. Pupils are equal, round, and reactive to light.  Neck: Normal range of motion. Neck supple.  Full range of motion. No cervical tenderness, discoloration or swelling or deformity.  Cardiovascular: Normal rate and regular rhythm.   Pulmonary/Chest: Effort normal.  Musculoskeletal: Normal range of motion. She exhibits no edema, tenderness or deformity.  Demonstrates full [employer] motion of both upper extremities. Patient is ambulatory.  Lymphadenopathy:    She has no cervical adenopathy.  Neurological: She is alert and oriented to person, place, and time. She has normal strength. No cranial nerve deficit or sensory deficit. Coordination and gait normal. GCS eye subscore is 4. GCS verbal subscore is 5. GCS motor subscore is 6.  Skin: Skin is warm and dry.  Abrasion to the proximal forearm/elbow on her aspect. No bony tenderness. No deformities, no swelling. Demonstrates full range of motion of the elbow, forearm and right upper extremity.  Psychiatric: She has a normal mood and affect. Her behavior is normal.  Nursing note and vitals reviewed.   Urgent Care Course   Clinical Course    .Marland KitchenLaceration Repair Date/Time: 12/07/2015 1:05 PM Performed by: Phineas Real, [place]elle [place]e Authorized by: Bradd Canary D   Consent:    Consent obtained:  Verbal   Consent given by:  Patient Anesthesia (see MAR for exact dosages):    Anesthesia method:  None Laceration details:    Location:  Face   Face location:  Forehead   Length (cm):  1.5   Depth (mm):  3 Repair type:    Repair type:  Simple Exploration:    Hemostasis achieved with:  Direct pressure   Wound extent: no areolar tissue violation noted, no fascia violation noted, no foreign bodies/material noted, no muscle damage noted, no nerve damage noted, no tendon damage noted, no underlying fracture noted and no vascular damage noted     Contaminated: no    Treatment:    Area cleansed with:  Saline and soap and water   Amount of cleaning:  Standard   Irrigation solution:  Sterile saline   Irrigation method:  Syringe Skin repair:    Repair method:  Tissue adhesive Approximation:    Approximation:  Close   Vermilion border: well-aligned   Post-procedure details:    Dressing:  Open (no dressing)   Patient tolerance of procedure:  Tolerated well, no immediate complications   (including critical care time)  Labs Review Labs Reviewed - No data to display  Imaging Review No results found.   Visual Acuity Review  Right Eye Distance:   Left Eye Distance:   Bilateral Distance:    Right Eye Near:   Left Eye Near:    Bilateral Near:         MDM   1. Fall from slip, trip, or stumble, initial encounter   2. Laceration of skin of forehead, initial encounter   3. Abrasion of right elbow, initial encounter    Meds ordered this encounter  Medications  . Tdap (BOOSTRIX) injection 0.5 mL   Wound care. Wound/Lac closure with glue Wound cleansing performed F/U as needed.    Hayden Rasmussenavid Dezerae Freiberger, NP 12/07/15 1311

## 2016-02-17 DIAGNOSIS — H43813 Vitreous degeneration, bilateral: Secondary | ICD-10-CM | POA: Diagnosis not present

## 2016-02-17 DIAGNOSIS — M130 Polyarthritis, unspecified: Secondary | ICD-10-CM | POA: Diagnosis not present

## 2016-02-17 DIAGNOSIS — I10 Essential (primary) hypertension: Secondary | ICD-10-CM | POA: Diagnosis not present

## 2016-02-27 ENCOUNTER — Other Ambulatory Visit: Payer: Self-pay | Admitting: Family Medicine

## 2016-02-27 DIAGNOSIS — Z1231 Encounter for screening mammogram for malignant neoplasm of breast: Secondary | ICD-10-CM

## 2016-03-26 ENCOUNTER — Ambulatory Visit: Payer: PRIVATE HEALTH INSURANCE

## 2016-04-06 ENCOUNTER — Ambulatory Visit
Admission: RE | Admit: 2016-04-06 | Discharge: 2016-04-06 | Disposition: A | Discharge status: Home / Self Care | Payer: Medicare Other | Source: Ambulatory Visit | Attending: Family Medicine | Admitting: Family Medicine

## 2016-04-06 DIAGNOSIS — Z1231 Encounter for screening mammogram for malignant neoplasm of breast: Secondary | ICD-10-CM | POA: Diagnosis not present

## 2016-06-12 DIAGNOSIS — L900 Lichen sclerosus et atrophicus: Secondary | ICD-10-CM | POA: Diagnosis not present

## 2016-08-20 DIAGNOSIS — I8311 Varicose veins of right lower extremity with inflammation: Secondary | ICD-10-CM | POA: Diagnosis not present

## 2016-08-20 DIAGNOSIS — N189 Chronic kidney disease, unspecified: Secondary | ICD-10-CM | POA: Diagnosis not present

## 2016-08-20 DIAGNOSIS — G5603 Carpal tunnel syndrome, bilateral upper limbs: Secondary | ICD-10-CM | POA: Diagnosis not present

## 2016-08-20 DIAGNOSIS — Z6833 Body mass index (BMI) 33.0-33.9, adult: Secondary | ICD-10-CM | POA: Diagnosis not present

## 2016-08-20 DIAGNOSIS — E782 Mixed hyperlipidemia: Secondary | ICD-10-CM | POA: Diagnosis not present

## 2016-08-20 DIAGNOSIS — G4730 Sleep apnea, unspecified: Secondary | ICD-10-CM | POA: Diagnosis not present

## 2016-10-12 DIAGNOSIS — Z961 Presence of intraocular lens: Secondary | ICD-10-CM | POA: Diagnosis not present

## 2016-10-12 DIAGNOSIS — H43813 Vitreous degeneration, bilateral: Secondary | ICD-10-CM | POA: Diagnosis not present

## 2016-10-12 DIAGNOSIS — H04123 Dry eye syndrome of bilateral lacrimal glands: Secondary | ICD-10-CM | POA: Diagnosis not present

## 2016-10-12 DIAGNOSIS — H539 Unspecified visual disturbance: Secondary | ICD-10-CM | POA: Diagnosis not present

## 2017-03-07 ENCOUNTER — Other Ambulatory Visit: Payer: Self-pay | Admitting: Family Medicine

## 2017-03-07 DIAGNOSIS — Z1231 Encounter for screening mammogram for malignant neoplasm of breast: Secondary | ICD-10-CM

## 2017-04-01 DIAGNOSIS — F4320 Adjustment disorder, unspecified: Secondary | ICD-10-CM | POA: Diagnosis not present

## 2017-04-01 DIAGNOSIS — G5603 Carpal tunnel syndrome, bilateral upper limbs: Secondary | ICD-10-CM | POA: Diagnosis not present

## 2017-04-01 DIAGNOSIS — G4730 Sleep apnea, unspecified: Secondary | ICD-10-CM | POA: Diagnosis not present

## 2017-04-01 DIAGNOSIS — I10 Essential (primary) hypertension: Secondary | ICD-10-CM | POA: Diagnosis not present

## 2017-04-01 DIAGNOSIS — M25519 Pain in unspecified shoulder: Secondary | ICD-10-CM | POA: Diagnosis not present

## 2017-04-01 DIAGNOSIS — I8311 Varicose veins of right lower extremity with inflammation: Secondary | ICD-10-CM | POA: Diagnosis not present

## 2017-04-01 DIAGNOSIS — N189 Chronic kidney disease, unspecified: Secondary | ICD-10-CM | POA: Diagnosis not present

## 2017-04-01 DIAGNOSIS — M130 Polyarthritis, unspecified: Secondary | ICD-10-CM | POA: Diagnosis not present

## 2017-04-01 DIAGNOSIS — E782 Mixed hyperlipidemia: Secondary | ICD-10-CM | POA: Diagnosis not present

## 2017-04-08 ENCOUNTER — Ambulatory Visit: Payer: Medicare Other

## 2017-04-11 ENCOUNTER — Ambulatory Visit: Payer: Medicare Other | Attending: Family Medicine | Admitting: Physical Therapy

## 2017-04-11 ENCOUNTER — Ambulatory Visit
Admission: RE | Admit: 2017-04-11 | Discharge: 2017-04-11 | Disposition: A | Discharge status: Home / Self Care | Payer: Medicare Other | Source: Ambulatory Visit | Attending: Family Medicine | Admitting: Family Medicine

## 2017-04-11 DIAGNOSIS — M25511 Pain in right shoulder: Secondary | ICD-10-CM | POA: Insufficient documentation

## 2017-04-11 DIAGNOSIS — M6281 Muscle weakness (generalized): Secondary | ICD-10-CM

## 2017-04-11 DIAGNOSIS — R293 Abnormal posture: Secondary | ICD-10-CM | POA: Diagnosis not present

## 2017-04-11 DIAGNOSIS — Z1231 Encounter for screening mammogram for malignant neoplasm of breast: Secondary | ICD-10-CM | POA: Diagnosis not present

## 2017-04-11 NOTE — Patient Instructions (Addendum)
Positioning: Lying on Affected Side    Elevate affected arm: Shoulder is positioned forward. Elbow out and palm turned upward. Position affected leg: Hip and knee slightly bent. Place other leg on pillow to support weight. May place pillow behind back  Copyright  VHI. All rights reserved.  Sleeping on Back    Place pillow under knees. A pillow with cervical support and a roll around waist are also helpful.   Copyright  VHI. All rights reserved.

## 2017-04-12 NOTE — Therapy (Signed)
[facility] [address] [place], [place], [address] Phone: [phone]   Fax:  [phone]  Physical Therapy Evaluation  Patient Details  Name: [name] MRN: [id number] Date of Birth: November 24, 1940 Referring Provider: Renaye Rakers   Encounter Date: 04/11/2017  PT End of Session - 04/12/17 1338    Visit Number  1    Number of Visits  9    Date for PT Re-Evaluation  06/10/17    Authorization Type  Medicare, BCBS-GCODE every 10th visit    PT Start Time  1017    PT Stop Time  1105    PT Time Calculation (min)  48 min    Activity Tolerance  Patient tolerated treatment well    Behavior During Therapy  [facility] for tasks assessed/performed       No past medical history on file.  Past Surgical History:  Procedure Laterality Date  . CAPSULOTOMY Right 07/25/2012   Procedure: MINOR PROCEDURE YAG LASER CAPSULOTOMY;  Surgeon: Vita Erm., MD;  Location: [facility] OR;  Service: Ophthalmology;  Laterality: Right;  . CATARACT EXTRACTION W/PHACO Left 04/27/2015   Procedure: CATARACT EXTRACTION PHACO AND INTRAOCULAR LENS PLACEMENT (IOC) LEFT EYE;  Surgeon: Chalmers Guest, MD;  Location: [facility] OR;  Service: Ophthalmology;  Laterality: Left;  . COLONOSCOPY    . EYE SURGERY Right    Caract    There were no vitals filed for this visit.   Subjective Assessment - 04/11/17 1023    Subjective  Pt reports having R shoulder and arm pain, with L hand pain as the beginning of the pain.  Used splint for L wrist and that is better.  Dr. Parke Simmers thinks it arthritis and referred to PT.  Pain has been going on for 2-3 weeks.  Hurts more at night.    Limitations  Other (comment) takes more time to get up at night, limiting sleep at times    Patient Stated Goals  Pt's goal for therapy is to get rid of pain.    Currently in Pain?  Yes    Pain Score  3  at night:  rates pain as 9/10    Pain Location  Neck shoulder    Pain Orientation  Right    Pain Descriptors / Indicators  Aching    Pain Type  Acute pain    Pain Radiating Towards  R upper arm and into R forearm at times    Pain Onset  1 to 4 weeks ago    Pain Frequency  Intermittent    Aggravating Factors   worse at night    Pain Relieving Factors  heating, rubbing, Ben-gay    Effect of Pain on Daily Activities  limits sleeping, reaching         [facility] PT Assessment - 04/11/17 1031      Assessment   Medical Diagnosis  neck and shoulder pain    Referring Provider  Renaye Rakers    Onset Date/Surgical Date  -- MD visit 04/01/17 (several weeks prior to that)    Hand Dominance  Right      Precautions   Precautions  None      Balance Screen   Has the patient fallen in the past 6 months  No    Has the patient had a decrease in activity level because of a fear of falling?   No    Is the patient reluctant to leave their home because of a fear of falling?  No      Home Environment   Living Environment  Private residence    Living Arrangements  Children    Available Help at Discharge  Family    Type of Home  House    Home Access  Stairs to enter    Entrance Stairs-Number of Steps  3    Entrance Stairs-Rails  Right    Home Layout  Two level    Home Equipment  Shower seat      Prior Function   Level of Independence  Independent    Vocation  Retired    Leisure  Enjoys working in the yard      Observation/Other Assessments   Focus on Therapeutic Outcomes (FOTO)   NA      Posture/Postural Control   Posture/Postural Control  Postural limitations    Postural Limitations  Forward head;Rounded Shoulders      ROM / Strength   AROM / PROM / Strength  Strength;AROM      AROM   Overall AROM Comments  Shoulder ROM:  R shoulder flexion 140, R shoulder abduction 156, limited shoulder internal and external rotation with pain increased with internal rotation.  Resistance provided with cervical ROM, no pain     AROM Assessment Site  Cervical    Cervical Flexion  15    Cervical  Extension  30    Cervical - Right Side Bend  15    Cervical - Left Side Bend  15 rotation    Cervical - Right Rotation  45    Cervical - Left Rotation  60      Strength   Overall Strength  Deficits    Overall Strength Comments  Avg grip strength Rhand:  65.7 lbs; L hand:  62.7 lbs    Strength Assessment Site  Shoulder;Wrist;Elbow    Right/Left Shoulder  Right;Left    Right Shoulder Flexion  3+/5 pain with resistance    Right Shoulder ABduction  4/5    Left Shoulder Flexion  4/5    Left Shoulder ABduction  4/5    Right/Left Elbow  Right;Left    Right Elbow Flexion  5/5    Right Elbow Extension  5/5    Left Elbow Flexion  5/5    Left Elbow Extension  5/5      Palpation   Palpation comment  appears tight to palpation along R upper traps, but no pain reported; tender to palpation along superior aspect of shoulder joint      Special Tests    Special Tests  --             Objective measurements completed on examination: See above findings.              PT Education - 04/12/17 1337    Education provided  Yes    Education Details  Sleeping positions for pain relief with sleeping    Person(s) Educated  Patient    Methods  Explanation;Demonstration;Handout    Comprehension  Verbalized understanding;Returned demonstration          PT Long Term Goals - 04/12/17 1345      PT LONG TERM GOAL #1   Title  Pt will be independent with HEP for improved flexibility and decreased pain.  TARGET 05/10/17    Time  4    Period  Weeks    Status  New    Target Date  05/10/17      PT LONG TERM GOAL #2  Title  Pt will report pain reduction by 50% for sleeping activities.    Time  4    Period  Weeks    Status  New    Target Date  05/10/17      PT LONG TERM GOAL #3   Title  Pt will improve cervical flexion by at least 10 degrees for improved positioning in pain-free ranges.    Time  4    Period  Weeks    Status  New    Target Date  05/10/17      PT LONG TERM GOAL #4    Title  Pt will perform shoulder ROM WFL with no c/o pain, for improved positioning and mobility in pain-free ranges.    Time  4    Period  Weeks    Status  New    Target Date  05/10/17      PT LONG TERM GOAL #5   Title  Pt will verbalize understanding of posture/body mechanics with sleeping and ADLS for improved pain-free activities.    Time  4    Period  Weeks    Status  New    Target Date  05/10/17             Plan - 04/12/17 1339    Clinical Impression Statement  Pt is a 76 year old female who presents to OP PT with reports of shoulder, neck, and arm pain which has been going on for approximately 3-4 weeks.  Pain is affecting sleeping, and is worse when patient wakes up in the mornings.  Pain is worst with resisted shoulder flexion, with active internal rotation; pt has limitations in range of motion, flexibility, shoulder strength, pain affecting sleep.  Pt would benefit from skilled PT to address the above stated deficits for improved functional and sleep activities with decreased pain.    History and Personal Factors relevant to plan of care:  pain onset 3-4 weeks ago    Clinical Presentation  Stable    Clinical Presentation due to:  newer pain onset affecting sleep    Clinical Decision Making  Low    Rehab Potential  Good    PT Frequency  2x / week    PT Duration  4 weeks plus eval    PT Treatment/Interventions  ADLs/Self Care Home Management;Electrical Stimulation;Ultrasound;Traction;Moist Heat;Therapeutic activities;Therapeutic exercise;Patient/family education;Manual techniques;Passive range of motion;Iontophoresis 4mg /ml Dexamethasone    PT Next Visit Plan  Discuss/review sleeping positioning; posture/body mechanics with ADLs; exercise to address shoulder, neck ROM and flexibility    Consulted and Agree with Plan of Care  Patient       Patient will benefit from skilled therapeutic intervention in order to improve the following deficits and impairments:  Decreased  range of motion, Decreased strength, Impaired flexibility, Impaired UE functional use, Improper body mechanics, Postural dysfunction, Pain  Visit Diagnosis: Acute pain of right shoulder  Muscle weakness (generalized)  Abnormal posture  G-Codes - 04/12/17 1350    Functional Assessment Tool Used (Outpatient Only)  pain with sleep up to 9/10; pain at eval 3/10 in R shoulder and arm    Functional Limitation  Changing and maintaining body position    Changing and Maintaining Body Position Current Status ([id number])  At least 40 percent but less than 60 percent impaired, limited or restricted    Changing and Maintaining Body Position Goal Status ([id number])  At least 1 percent but less than 20 percent impaired, limited or restricted  Problem List There are no active problems to display for this patient.   Tammye Kahler W. 04/12/2017, 1:51 PM  Gean Maidens., PT   [facility] [address] [place], [place], [address] Phone: [phone]   Fax:  [phone]  Name: [name] MRN: [id number] Date of Birth: 1940-08-09

## 2017-04-16 ENCOUNTER — Ambulatory Visit: Payer: Medicare Other | Admitting: Physical Therapy

## 2017-04-17 ENCOUNTER — Encounter: Payer: Self-pay | Admitting: Physical Therapy

## 2017-04-17 ENCOUNTER — Ambulatory Visit: Payer: Medicare Other | Admitting: Physical Therapy

## 2017-04-17 DIAGNOSIS — M6281 Muscle weakness (generalized): Secondary | ICD-10-CM

## 2017-04-17 DIAGNOSIS — R293 Abnormal posture: Secondary | ICD-10-CM | POA: Diagnosis not present

## 2017-04-17 DIAGNOSIS — M25511 Pain in right shoulder: Secondary | ICD-10-CM | POA: Diagnosis not present

## 2017-04-17 NOTE — Patient Instructions (Addendum)
   Shoulder flexion stretch with swiss ball  Stand with both hands on the ball against a wall. Slowly roll the ball up the wall, holding at end range, stretching shoulder. This is also good stretch for the upper and low back.  Hold for around 5 seconds. Repeat for 10 reps. 1-2 times a day.  Can use a towel if doing this at home and not at gym where balls are.      While seated, , rest your hand in the small of your back/or sit on it with palm up to stabilize shoulder. Rotate your head away from that arm and down towards your chest/armpit until a stretch is felt. Let gravity do the work and do not force the stretch with your neck muscles. Hold for 30 seconds. Repeat for 3 reps. 1-2 times a day.    Scapular Retraction  Start: Position your arm at 90 degrees by your side with Theraband in hand as pictured.  Movement: Against the resistance of the band, squeeze your shoulder blades together as you gently pull back on bands, brining hands toward your body. Hold for ~5 seconds and then slowly return to starting position. Keep tall posture with forward gaze, do no allow trunk/back to bend.  10 reps, 1-2 times day.     Shoulder Extension with Theraband  Start with a little tension on the thera band, arms straight, and about 45 degrees out from your body. Keeping arms straight, bring arms back and down towards your sides. Return to start. Keep good posture throughout the motions. ~5 second holds. 10 reps, 1-2 times a day.

## 2017-04-17 NOTE — Therapy (Signed)
[facility] [address] [place], [place], [address] Phone: [phone]   Fax:  [phone]  Physical Therapy Treatment  Patient Details  Name: WEALTHY DANIELSKI MRN: [id number] Date of Birth: 12/04/1940 Referring Provider: Renaye Rakers   Encounter Date: 04/17/2017  PT End of Session - 04/17/17 0940    Visit Number  2    Number of Visits  9    Date for PT Re-Evaluation  06/10/17    Authorization Type  Medicare, BCBS-GCODE every 10th visit    PT Start Time  0935    PT Stop Time  1021    PT Time Calculation (min)  46 min    Activity Tolerance  Patient tolerated treatment well    Behavior During Therapy  [facility] for tasks assessed/performed       History reviewed. No pertinent past medical history.  Past Surgical History:  Procedure Laterality Date  . CAPSULOTOMY Right 07/25/2012   Procedure: MINOR PROCEDURE YAG LASER CAPSULOTOMY;  Surgeon: Vita Erm., MD;  [facility];  Service: Ophthalmology;  Laterality: Right;  . CATARACT EXTRACTION W/PHACO Left 04/27/2015   Procedure: CATARACT EXTRACTION PHACO AND INTRAOCULAR LENS PLACEMENT (IOC) LEFT EYE;  Surgeon: Chalmers Guest, MD;  Location: [facility] OR;  Service: Ophthalmology;  Laterality: Left;  . COLONOSCOPY    . EYE SURGERY Right    Caract    There were no vitals filed for this visit.  Subjective Assessment - 04/17/17 0937    Subjective  No new complaints. No falls to report. Pain comes and goes, worse at night limiting her sleep.     Limitations  Other (comment) pain limits sleep at night    Patient Stated Goals  Pt's goal for therapy is to get rid of pain.    Currently in Pain?  Yes    Pain Score  2     Pain Location  Shoulder    Pain Orientation  Right    Pain Descriptors / Indicators  Aching    Pain Type  Acute pain    Pain Onset  1 to 4 weeks ago    Pain Frequency  Intermittent    Aggravating Factors   worse at night    Pain Relieving Factors   heat/cold alternating, aspercreame, tried icy hot- "burned her up"-too hot.     Effect of Pain on Daily Activities  limits sleep, reaching            [facility] Adult PT Treatment/Exercise - 04/17/17 0949      Exercises   Exercises  Shoulder;Neck      Shoulder Exercises: Standing   Extension  AROM;Strengthening;Both;10 reps;Theraband    Theraband Level (Shoulder Extension)  Level 2 (Red)    Extension Limitations  cues on posture and ex form    Row  AROM;Strengthening;Both;10 reps;Theraband    Theraband Level (Shoulder Row)  Level 2 (Red)    Row Limitations  cues on posture and technique      Shoulder Exercises: Stretch   Wall Stretch - Flexion  Other (comment);Limitations    Wall Stretch - Flexion Limitations  rolling ball up wall with flexion stretch at top for ~5 sec's and back down x 10 reps       Manual Therapy   Manual therapy comments  all manual therapy performed to right shoulder for decreased pain and for increased range of motion    Joint Mobilization  right glenohumeral joint in inferior directions and then posterior direction. grade  1-2 mobs performed in hook lying position.                      Soft tissue mobilization  to upper trap, scalenes and along scapula for decreased tightness    Passive ROM  right shoulder flexion, abdcution and ER within pain free ranges only x 5 reps each direction       Neck Exercises: Stretches   Upper Trapezius Stretch  3 reps;30 seconds;Limitations    Upper Trapezius Stretch Limitations  cues on technique and hold times          PT Education - 04/17/17 1022    Education provided  Yes    Education Details  HEP for stretching and strengthening    Person(s) Educated  Patient    Methods  Explanation;Demonstration;Verbal cues;Handout    Comprehension  Verbalized understanding;Returned demonstration;Verbal cues required;Need further instruction          PT Long Term Goals - 04/12/17 1345      PT LONG TERM GOAL #1   Title  Pt  will be independent with HEP for improved flexibility and decreased pain.  TARGET 05/10/17    Time  4    Period  Weeks    Status  New    Target Date  05/10/17      PT LONG TERM GOAL #2   Title  Pt will report pain reduction by 50% for sleeping activities.    Time  4    Period  Weeks    Status  New    Target Date  05/10/17      PT LONG TERM GOAL #3   Title  Pt will improve cervical flexion by at least 10 degrees for improved positioning in pain-free ranges.    Time  4    Period  Weeks    Status  New    Target Date  05/10/17      PT LONG TERM GOAL #4   Title  Pt will perform shoulder ROM WFL with no c/o pain, for improved positioning and mobility in pain-free ranges.    Time  4    Period  Weeks    Status  New    Target Date  05/10/17      PT LONG TERM GOAL #5   Title  Pt will verbalize understanding of posture/body mechanics with sleeping and ADLS for improved pain-free activities.    Time  4    Period  Weeks    Status  New    Target Date  05/10/17            Plan - 04/17/17 0940    Clinical Impression Statement  Today's skilled session focused focused on manual therapy initially for decreased shoulder pain. Remainder of session focused on establishing an HEP for strengthening and flexibility with no issues reported with performance in session. Pt is progressing toward goals and should benefit from continued PT to progress toward unmet goals.     Rehab Potential  Good    PT Frequency  2x / week    PT Duration  4 weeks plus eval    PT Treatment/Interventions  ADLs/Self Care Home Management;Electrical Stimulation;Ultrasound;Traction;Moist Heat;Therapeutic activities;Therapeutic exercise;Patient/family education;Manual techniques;Passive range of motion;Iontophoresis 4mg /ml Dexamethasone    PT Next Visit Plan  Discuss/review sleeping positioning; posture/body mechanics with ADLs; exercise to address shoulder, neck ROM and flexibility    Consulted and Agree with Plan of Care   Patient  Patient will benefit from skilled therapeutic intervention in order to improve the following deficits and impairments:  Decreased range of motion, Decreased strength, Impaired flexibility, Impaired UE functional use, Improper body mechanics, Postural dysfunction, Pain  Visit Diagnosis: Acute pain of right shoulder  Muscle weakness (generalized)  Abnormal posture     Problem List There are no active problems to display for this patient.   Sallyanne KusterKathy Elisse Pennick, PTA, [facility] [address] [facility] [address] [facility] [address], [address] [place], [place] [address] [phone] 04/17/17, 9:57 PM   Name: Reva BoresCarrie G Gambrell MRN: [id number] Date of Birth: [birth year]

## 2017-04-19 ENCOUNTER — Ambulatory Visit: Payer: Medicare Other | Admitting: Physical Therapy

## 2017-04-19 DIAGNOSIS — R293 Abnormal posture: Secondary | ICD-10-CM | POA: Diagnosis not present

## 2017-04-19 DIAGNOSIS — M25511 Pain in right shoulder: Secondary | ICD-10-CM

## 2017-04-19 DIAGNOSIS — M6281 Muscle weakness (generalized): Secondary | ICD-10-CM | POA: Diagnosis not present

## 2017-04-19 NOTE — Therapy (Signed)
[facility] [address] [place], [place], [address] Phone: [phone]   Fax:  [phone]  Physical Therapy Treatment  Patient Details  Name: Yolanda BoresCarrie G Deininger MRN: [id number] Date of Birth: 1940/07/07 Referring Provider: Renaye RakersBland, Veita   Encounter Date: 04/19/2017  PT End of Session - 04/19/17 1623    Visit Number  3    Number of Visits  9    Date for PT Re-Evaluation  06/10/17    Authorization Type  Medicare, BCBS-GCODE every 10th visit    PT Start Time  0933    PT Stop Time  1016    PT Time Calculation (min)  43 min    Activity Tolerance  Patient tolerated treatment well 1/10 pain at end of session    Behavior During Therapy  [facility] for tasks assessed/performed       No past medical history on file.  Past Surgical History:  Procedure Laterality Date  . CAPSULOTOMY Right 07/25/2012   Procedure: MINOR PROCEDURE YAG LASER CAPSULOTOMY;  Surgeon: Vita Ermhomas E Brewington Jr., MD;  [facility];  Service: Ophthalmology;  Laterality: Right;  . CATARACT EXTRACTION W/PHACO Left 04/27/2015   Procedure: CATARACT EXTRACTION PHACO AND INTRAOCULAR LENS PLACEMENT (IOC) LEFT EYE;  Surgeon: Chalmers Guestoy Whitaker, MD;  [facility];  Service: Ophthalmology;  Laterality: Left;  . COLONOSCOPY    . EYE SURGERY Right    Caract    There were no vitals filed for this visit.  Subjective Assessment - 04/19/17 0940    Subjective  Reports putting pillow up under arm has helped.  Can you tell me what the difference is between nerve pain and arthritis?    Limitations  Other (comment) pain limits sleep at night    Patient Stated Goals  Pt's goal for therapy is to get rid of pain.    Currently in Pain?  Yes    Pain Score  1     Pain Location  Shoulder    Pain Orientation  Right    Pain Descriptors / Indicators  Aching    Pain Type  Acute pain    Pain Onset  1 to 4 weeks ago    Pain Frequency  Intermittent    Aggravating Factors   worse at  night    Pain Relieving Factors  heat and ice                      [facility] Adult PT Treatment/Exercise - 04/19/17 0946      Self-Care   Self-Care  Other Self-Care Comments    Other Self-Care Comments   Discussed exercises in pain-free ranges as well as posture/positionoing during exercises and ADLs.  Modified HEP (took out theraband), due to pt's poor posture and positoning with these two exercises.  Answered patient's questions on difference between nerve pain and arthritis, in general terms.  Assessed lateral neck flexion with axial loading (no pain reproduced), but patient reports pain off and on into upper arm and forearm area.        Exercises   Exercises  Shoulder;Neck      Neck Exercises: Seated   Shoulder Shrugs  10 reps 2 sets    Shoulder Rolls  Backwards;10 reps 2 sets     Postural Training  Seated upper traps stretch, 3 reps, review of HEP-pt return demo understanding (no pain)      Shoulder Exercises: Seated   Retraction  AROM;Both;10 reps      Shoulder  Exercises: Standing   Extension  AROM;Strengthening;Both;10 reps;Theraband    Theraband Level (Shoulder Extension)  Level 2 (Red)    Extension Weight (lbs)  REview of HEP-provided instruction for positioning, as pt tends to hike shoulder     Extension Limitations  cues on posture and ex form    Row  AROM;Strengthening;Both;10 reps;Theraband    Theraband Level (Shoulder Row)  Level 2 (Red)    Row Limitations  cues on posture and technique      Shoulder Exercises: Stretch   Wall Stretch - Flexion  Other (comment);Limitations    Wall Stretch - Flexion Limitations  rolling ball up wall with flexion stretch at top for ~5 sec's and back down x 10 reps .  No pain reported with session, but pt reports having pain when doing this at home.  Spent time educating patient to move in pain-free ranges with exercises.    Other Shoulder Stretches  Doorframe (pec) stretch, 3 reps x 30 seconds      Attempted pec stretch at  wall/corner, but pt does not have good form with RUE.  Better position at doorframe.       PT Education - 04/19/17 1623    Education provided  Yes    Education Details  HEP updates; recommended patient hold on resisted shoulder extension and perform rows/scapular retraction wihtout band to aid in better posture/form    Person(s) Educated  Patient    Methods  Explanation;Demonstration;Verbal cues;Handout    Comprehension  Returned demonstration;Verbalized understanding          PT Long Term Goals - 04/12/17 1345      PT LONG TERM GOAL #1   Title  Pt will be independent with HEP for improved flexibility and decreased pain.  TARGET 05/10/17    Time  4    Period  Weeks    Status  New    Target Date  05/10/17      PT LONG TERM GOAL #2   Title  Pt will report pain reduction by 50% for sleeping activities.    Time  4    Period  Weeks    Status  New    Target Date  05/10/17      PT LONG TERM GOAL #3   Title  Pt will improve cervical flexion by at least 10 degrees for improved positioning in pain-free ranges.    Time  4    Period  Weeks    Status  New    Target Date  05/10/17      PT LONG TERM GOAL #4   Title  Pt will perform shoulder ROM WFL with no c/o pain, for improved positioning and mobility in pain-free ranges.    Time  4    Period  Weeks    Status  New    Target Date  05/10/17      PT LONG TERM GOAL #5   Title  Pt will verbalize understanding of posture/body mechanics with sleeping and ADLS for improved pain-free activities.    Time  4    Period  Weeks    Status  New    Target Date  05/10/17            Plan - 04/19/17 1624    Clinical Impression Statement  Worked on review of HEP and addressing additional shoulder exercises and stretches.  Pt tends to hike R shoulder and has poor positioning/posture with theraband exercises, so requested pt d/c theraband and spent time  educating patient to perform exercises with good posture and in pain-free ranges.  Will  continue to benefit from skilled PT to progress towards goals.  Pt does not rate any increase in pain throughout session.    Rehab Potential  Good    PT Frequency  2x / week    PT Duration  4 weeks plus eval    PT Treatment/Interventions  ADLs/Self Care Home Management;Electrical Stimulation;Ultrasound;Traction;Moist Heat;Therapeutic activities;Therapeutic exercise;Patient/family education;Manual techniques;Passive range of motion;Iontophoresis 4mg /ml Dexamethasone    PT Next Visit Plan  Posture/body mechanics with ADLs; REview HEP, exercises to address shoulder, neck ROM and flexibility    Consulted and Agree with Plan of Care  Patient       Patient will benefit from skilled therapeutic intervention in order to improve the following deficits and impairments:  Decreased range of motion, Decreased strength, Impaired flexibility, Impaired UE functional use, Improper body mechanics, Postural dysfunction, Pain  Visit Diagnosis: Abnormal posture  Acute pain of right shoulder  Muscle weakness (generalized)     Problem List There are no active problems to display for this patient.   Lela Murfin W. 04/19/2017, 4:27 PM  Lonia BloodAmy Yolette Hastings, PT 04/19/17 4:29 PM Phone: [phone] Fax: [phone]    [facility] [address] [place], [place], [address] Phone: [phone]   Fax:  [phone]  Name: Yolanda BoresCarrie G Figueroa MRN: [id number] Date of Birth: 1941/03/15

## 2017-04-19 NOTE — Patient Instructions (Addendum)
Pectoral Stretch    With arms behind doorjamb, feet apart, gently lean forward. Stretch is felt across chest. Hold _15-30___ seconds. Repeat __3__ times. Do ___1-2_ sessions per day.  http://gt2.exer.us/33   Copyright  VHI. All rights reserved.  Shoulder Roll    Move shoulders forward, up, back, then down. Continue circling shoulders backward __10_ times, 2 sets.  Do 1-2 times per day. Copyright  VHI. All rights reserved.

## 2017-04-24 ENCOUNTER — Encounter: Payer: Self-pay | Admitting: Physical Therapy

## 2017-04-24 ENCOUNTER — Ambulatory Visit: Payer: Medicare Other | Admitting: Physical Therapy

## 2017-04-24 DIAGNOSIS — R293 Abnormal posture: Secondary | ICD-10-CM

## 2017-04-24 DIAGNOSIS — M25511 Pain in right shoulder: Secondary | ICD-10-CM | POA: Diagnosis not present

## 2017-04-24 DIAGNOSIS — M6281 Muscle weakness (generalized): Secondary | ICD-10-CM | POA: Diagnosis not present

## 2017-04-24 NOTE — Therapy (Signed)
[facility] [address] [place], [place], [address] Phone: [phone]   Fax:  [phone]  Physical Therapy Treatment  Patient Details  Name: Yolanda BoresCarrie G Figueroa MRN: [id number] Date of Birth: May 01, 1940 Referring Provider: Renaye RakersBland, Veita   Encounter Date: 04/24/2017  PT End of Session - 04/24/17 1220    Visit Number  4    Number of Visits  9    Date for PT Re-Evaluation  06/10/17    Authorization Type  Medicare, BCBS-GCODE every 10th visit    PT Start Time  1102    PT Stop Time  1145    PT Time Calculation (min)  43 min    Activity Tolerance  Patient tolerated treatment well No pain at end of session    Behavior During Therapy  [facility] for tasks assessed/performed       History reviewed. No pertinent past medical history.  Past Surgical History:  Procedure Laterality Date  . CAPSULOTOMY Right 07/25/2012   Procedure: MINOR PROCEDURE YAG LASER CAPSULOTOMY;  Surgeon: Vita Ermhomas E Brewington Jr., MD;  [facility];  Service: Ophthalmology;  Laterality: Right;  . CATARACT EXTRACTION W/PHACO Left 04/27/2015   Procedure: CATARACT EXTRACTION PHACO AND INTRAOCULAR LENS PLACEMENT (IOC) LEFT EYE;  Surgeon: Chalmers Guestoy Whitaker, MD;  [facility];  Service: Ophthalmology;  Laterality: Left;  . COLONOSCOPY    . EYE SURGERY Right    Caract    There were no vitals filed for this visit.  Subjective Assessment - 04/24/17 1105    Subjective  There was one night that "I didn't feel a thing", and woke up and felt good.  Still feels better during the day.    Limitations  Other (comment) pain limits sleep at night    Patient Stated Goals  Pt's goal for therapy is to get rid of pain.    Currently in Pain?  Yes    Pain Score  1     Pain Location  Shoulder    Pain Orientation  Right    Pain Descriptors / Indicators  Other (Comment) just barely there-hurt    Pain Type  Acute pain    Pain Onset  1 to 4 weeks ago    Pain Frequency   Intermittent    Aggravating Factors   worse at night-rates maybe as a 4-5/10 at night over the past week    Pain Relieving Factors  heat and ice         [facility] PT Assessment - 04/24/17 0001      AROM   Overall AROM Comments  Shoulder flexion 146 degree, R shoulder abduction 155 degrees; no pain                  [facility] Adult PT Treatment/Exercise - 04/24/17 0001      Self-Care   Self-Care  Other Self-Care Comments    Other Self-Care Comments   Reviewed sleeping positions, and discussed other ADLs, functional activities with good posture/good body mechanics, such as reaching, getting in and out of car and bed, to avoid pain      Exercises   Exercises  Shoulder;Neck      Neck Exercises: Seated   Shoulder Rolls  Backwards;10 reps    Postural Training  Seated upper traps stretch, 3 reps R side, x 15-30 seconds    Other Seated Exercise  Seated posture stretch with hands supported on mat, slightly behind patient, 3 reps x 30 seconds      Neck  Exercises: Supine   Neck Retraction  5 reps      Shoulder Exercises: Supine   Other Supine Exercises  Shoulder press supine, (in decompression position), 2 sets x 10 reps      Shoulder Exercises: Seated   Retraction  AROM;Both;10 reps      Shoulder Exercises: Stretch   Other Shoulder Stretches  Doorframe (pec) stretch, 3 reps x 30 seconds Cues for positioning of feet in relation to doorframe      Manual Therapy   Manual therapy comments  all manual therapy performed to right shoulder for decreased pain and for increased range of motion    Joint Mobilization  right glenohumeral joint in inferior directions and then posterior direction. grade 1-2 mobs performed in hook lying position.                      Passive ROM  right shoulder flexion, abduction and ER within pain free ranges only x 5 reps each direction              PT Education - 04/24/17 1219    Education provided  Yes    Education Details  HEP additions; reviewed  HEP; body mechanics/posture with ADLs    Person(s) Educated  Patient    Methods  Explanation;Demonstration;Verbal cues;Handout    Comprehension  Verbalized understanding;Returned demonstration          PT Long Term Goals - 04/12/17 1345      PT LONG TERM GOAL #1   Title  Pt will be independent with HEP for improved flexibility and decreased pain.  TARGET 05/10/17    Time  4    Period  Weeks    Status  New    Target Date  05/10/17      PT LONG TERM GOAL #2   Title  Pt will report pain reduction by 50% for sleeping activities.    Time  4    Period  Weeks    Status  New    Target Date  05/10/17      PT LONG TERM GOAL #3   Title  Pt will improve cervical flexion by at least 10 degrees for improved positioning in pain-free ranges.    Time  4    Period  Weeks    Status  New    Target Date  05/10/17      PT LONG TERM GOAL #4   Title  Pt will perform shoulder ROM WFL with no c/o pain, for improved positioning and mobility in pain-free ranges.    Time  4    Period  Weeks    Status  New    Target Date  05/10/17      PT LONG TERM GOAL #5   Title  Pt will verbalize understanding of posture/body mechanics with sleeping and ADLS for improved pain-free activities.    Time  4    Period  Weeks    Status  New    Target Date  05/10/17            Plan - 04/24/17 1221    Clinical Impression Statement  Continued to review and update HEP, addressing shoulder exercises.  Pt with improved shoulder positioning, and no c/o pain by end of session today.  Pt also reports having one night pain-free earlier this week, so she feels she is making progress.  Will continue to beneift from skilled PT to progress towards goals.    Rehab Potential  Good    PT Frequency  2x / week    PT Duration  4 weeks plus eval    PT Treatment/Interventions  ADLs/Self Care Home Management;Electrical Stimulation;Ultrasound;Traction;Moist Heat;Therapeutic activities;Therapeutic exercise;Patient/family  education;Manual techniques;Passive range of motion;Iontophoresis 4mg /ml Dexamethasone    PT Next Visit Plan  Review posture/body mechanics with ADLs; REview HEP, exercises to address shoulder, neck ROM and flexibility    Consulted and Agree with Plan of Care  Patient       Patient will benefit from skilled therapeutic intervention in order to improve the following deficits and impairments:  Decreased range of motion, Decreased strength, Impaired flexibility, Impaired UE functional use, Improper body mechanics, Postural dysfunction, Pain  Visit Diagnosis: Acute pain of right shoulder  Abnormal posture     Problem List There are no active problems to display for this patient.   Caulder Wehner W. 04/24/2017, 12:23 PM  Gean Maidens., PT   [facility] [address] [place], [place], [address] Phone: [phone]   Fax:  [phone]  Name: CITLALLY CAPTAIN MRN: [id number] Date of Birth: [birth year]

## 2017-04-24 NOTE — Patient Instructions (Signed)
Provided handout for decompression exercise position with shoulder press into bed, 5 reps x 2 sets

## 2017-04-26 ENCOUNTER — Ambulatory Visit: Payer: Medicare Other | Admitting: Physical Therapy

## 2017-04-26 ENCOUNTER — Encounter: Payer: Self-pay | Admitting: Physical Therapy

## 2017-04-26 DIAGNOSIS — M6281 Muscle weakness (generalized): Secondary | ICD-10-CM

## 2017-04-26 DIAGNOSIS — M25511 Pain in right shoulder: Secondary | ICD-10-CM | POA: Diagnosis not present

## 2017-04-26 DIAGNOSIS — R293 Abnormal posture: Secondary | ICD-10-CM

## 2017-04-26 NOTE — Therapy (Signed)
[facility] [address] [place], [place], [address] Phone: [phone]   Fax:  [phone]  Physical Therapy Treatment  Patient Details  Name: Yolanda BoresCarrie G Jividen MRN: [id number] Date of Birth: 1940-05-19 Referring Provider: Renaye RakersBland, Veita   Encounter Date: 04/26/2017  PT End of Session - 04/26/17 1108    Visit Number  5    Number of Visits  9    Date for PT Re-Evaluation  06/10/17    Authorization Type  Medicare, BCBS-GCODE every 10th visit    PT Start Time  1102    PT Stop Time  1145    PT Time Calculation (min)  43 min    Activity Tolerance  Patient tolerated treatment well No pain at end of session    Behavior During Therapy  [facility] tasks assessed/performed       History reviewed. No pertinent past medical history.  Past Surgical History:  Procedure Laterality Date  . CAPSULOTOMY Right 07/25/2012   Procedure: MINOR PROCEDURE YAG LASER CAPSULOTOMY;  Surgeon: Vita Ermhomas E Brewington Jr., MD;  [facility];  Service: Ophthalmology;  Laterality: Right;  . CATARACT EXTRACTION W/PHACO Left 04/27/2015   Procedure: CATARACT EXTRACTION PHACO AND INTRAOCULAR LENS PLACEMENT (IOC) LEFT EYE;  Surgeon: Chalmers Guestoy Whitaker, MD;  [facility];  Service: Ophthalmology;  Laterality: Left;  . COLONOSCOPY    . EYE SURGERY Right    Caract    There were no vitals filed for this visit.  Subjective Assessment - 04/26/17 1106    Subjective  Woke up at 3:15 this morning with pain, 5/10 at that time. Better now, still there.     Limitations  Other (comment) limits sleeping at night.    Patient Stated Goals  Pt's goal for therapy is to get rid of pain.    Currently in Pain?  Yes    Pain Score  2     Pain Location  Shoulder    Pain Orientation  Right    Pain Descriptors / Indicators  Aching;Sore    Pain Type  Acute pain    Pain Onset  1 to 4 weeks ago    Pain Frequency  Intermittent    Aggravating Factors   worse at night- varies in  intensity    Pain Relieving Factors  heat and ice           [facility] Adult PT Treatment/Exercise - 04/26/17 1126      Neck Exercises: Seated   Shoulder Rolls  Backwards;10 reps;Limitations    Shoulder Rolls Limitations  manual/verbal/visual cues needed for correct technique, to relax trunk and just move shoulders      Shoulder Exercises: Standing   Horizontal ABduction  AROM;Strengthening;Both;10 reps;Theraband;Limitations    Theraband Level (Shoulder Horizontal ABduction)  Level 2 (Red)    Horizontal ABduction Limitations  with back to wall for postural support: cues on form and technique, 5 sec hold times with each rep    Extension  AROM;Strengthening;Both;10 reps;Theraband    Theraband Level (Shoulder Extension)  Level 2 (Red)    Extension Limitations  cues on posture and ex form, 5 sec holds each rep    Row  AROM;Strengthening;Both;10 reps;Theraband    Theraband Level (Shoulder Row)  Level 2 (Red)    Row Limitations  cues on posture and technique, 5 sec holds each rep      Shoulder Exercises: Therapy Ball   Flexion  15 reps;Limitations    Flexion Limitations  cues on form and hold  at top of each rep for flexion stretch      Shoulder Exercises: ROM/Strengthening   UBE (Upper Arm Bike)  level 1.5 x 2 minutes each forward/backward with cues/emphasis on posture for strengthening and       Manual Therapy   Manual Therapy  Joint mobilization;Soft tissue mobilization;Myofascial release;Passive ROM;Neural Stretch    Manual therapy comments  all manual therapy performed to right shoulder for decreased pain and for increased range of motion    Joint Mobilization  right glenohumeral joint in inferior directions and then posterior direction. grade 1-2 mobs performed in hook lying position.                      Soft tissue mobilization  to upper trap, scalenes and along scapula for decreased tightness    Passive ROM  right shoulder flexion, abduction and ER within pain free ranges only x 5  reps each direction     Neural Stretch  right UE for radial nerve stretch with 30 sec holds for 3 reps, passive stretch          PT Long Term Goals - 04/12/17 1345      PT LONG TERM GOAL #1   Title  Pt will be independent with HEP for improved flexibility and decreased pain.  TARGET 05/10/17    Time  4    Period  Weeks    Status  New    Target Date  05/10/17      PT LONG TERM GOAL #2   Title  Pt will report pain reduction by 50% for sleeping activities.    Time  4    Period  Weeks    Status  New    Target Date  05/10/17      PT LONG TERM GOAL #3   Title  Pt will improve cervical flexion by at least 10 degrees for improved positioning in pain-free ranges.    Time  4    Period  Weeks    Status  New    Target Date  05/10/17      PT LONG TERM GOAL #4   Title  Pt will perform shoulder ROM WFL with no c/o pain, for improved positioning and mobility in pain-free ranges.    Time  4    Period  Weeks    Status  New    Target Date  05/10/17      PT LONG TERM GOAL #5   Title  Pt will verbalize understanding of posture/body mechanics with sleeping and ADLS for improved pain-free activities.    Time  4    Period  Weeks    Status  New    Target Date  05/10/17          Plan - 04/26/17 1108    Clinical Impression Statement  Today's skilled session continued to focus on decreased pain, strengthening and increased shoulder range of motion with no issues reported in session. Pt is making steady progress toward goals and should benefit from continued PT to progress toward unmet goals    Rehab Potential  Good    PT Frequency  2x / week    PT Duration  4 weeks plus eval    PT Treatment/Interventions  ADLs/Self Care Home Management;Electrical Stimulation;Ultrasound;Traction;Moist Heat;Therapeutic activities;Therapeutic exercise;Patient/family education;Manual techniques;Passive range of motion;Iontophoresis 4mg /ml Dexamethasone    PT Next Visit Plan  , exercises to address shoulder,  neck ROM and flexibility    Consulted and Agree  with Plan of Care  Patient       Patient will benefit from skilled therapeutic intervention in order to improve the following deficits and impairments:  Decreased range of motion, Decreased strength, Impaired flexibility, Impaired UE functional use, Improper body mechanics, Postural dysfunction, Pain  Visit Diagnosis: Acute pain of right shoulder  Abnormal posture  Muscle weakness (generalized)     Problem List There are no active problems to display for this patient.   Sallyanne Kuster, PTA, [facility] [address], [address] [place], [place] [address] [phone] 04/26/17, 6:50 PM   Name: MALAYIAH MCBRAYER MRN: [id number] Date of Birth: [birth year]

## 2017-05-01 ENCOUNTER — Encounter: Payer: Self-pay | Admitting: Physical Therapy

## 2017-05-01 ENCOUNTER — Ambulatory Visit: Payer: Medicare Other | Attending: Family Medicine | Admitting: Physical Therapy

## 2017-05-01 DIAGNOSIS — M6281 Muscle weakness (generalized): Secondary | ICD-10-CM | POA: Diagnosis not present

## 2017-05-01 DIAGNOSIS — R293 Abnormal posture: Secondary | ICD-10-CM | POA: Diagnosis not present

## 2017-05-01 DIAGNOSIS — M25511 Pain in right shoulder: Secondary | ICD-10-CM | POA: Diagnosis not present

## 2017-05-01 NOTE — Therapy (Signed)
[facility] [address] [place], [place], [address] Phone: [phone]   Fax:  [phone]  Physical Therapy Treatment  Patient Details  Name: NEIVA MAENZA MRN: [id number] Date of Birth: 1941/02/14 Referring Provider: Renaye Rakers   Encounter Date: 05/01/2017  PT End of Session - 05/01/17 1154    Visit Number  6    Number of Visits  9    Date for PT Re-Evaluation  06/10/17    Authorization Type  Medicare, BCBS-GCODE every 10th visit    PT Start Time  1150    PT Stop Time  1230    PT Time Calculation (min)  40 min    Activity Tolerance  Patient tolerated treatment well No pain at end of session    Behavior During Therapy  [facility] for tasks assessed/performed       History reviewed. No pertinent past medical history.  Past Surgical History:  Procedure Laterality Date  . CAPSULOTOMY Right 07/25/2012   Procedure: MINOR PROCEDURE YAG LASER CAPSULOTOMY;  Surgeon: Vita Erm., MD;  [facility];  Service: Ophthalmology;  Laterality: Right;  . CATARACT EXTRACTION W/PHACO Left 04/27/2015   Procedure: CATARACT EXTRACTION PHACO AND INTRAOCULAR LENS PLACEMENT (IOC) LEFT EYE;  Surgeon: Chalmers Guest, MD;  Location: [facility] OR;  Service: Ophthalmology;  Laterality: Left;  . COLONOSCOPY    . EYE SURGERY Right    Caract    There were no vitals filed for this visit.  Subjective Assessment - 05/01/17 1152    Subjective  Reports she is doing much better. Has only had minimal pain a couple of days since last visit. Her sleeping at night is improving as well.     Limitations  Other (comment) limits sleeping at night    Patient Stated Goals  Pt's goal for therapy is to get rid of pain.    Currently in Pain?  Yes    Pain Score  1  with movement, 0/10 at rest    Pain Location  Shoulder    Pain Orientation  Right    Pain Descriptors / Indicators  Aching;Sore    Pain Type  Acute pain    Pain Onset  More than a month ago     Pain Frequency  Intermittent    Aggravating Factors   worse at night, varies in intensity    Pain Relieving Factors  ice, rosemary oil, exercise/stretching           [facility] Adult PT Treatment/Exercise - 05/01/17 1155      Neck Exercises: Seated   X to V  10 reps;Limitations;Weight    X to V Weights (lbs)  1    X to V Limitations  back against red pball at the wall: cues on posture and ex form, cues to maintain neutral cervical position    W Back  10 reps;Weight;Limitations    W Back Weights (lbs)  1    W Back Limitations  with back to pball at wall: cues on posture and ex form, cues to keep neutral cervical position    Shoulder Rolls  Backwards;10 reps;Limitations    Shoulder Rolls Limitations  with back agaist pball, holding 1# hand weights down by her side, cues on posture and sequencing.       Shoulder Exercises: Standing   Horizontal ABduction  AROM;Strengthening;Both;10 reps;Theraband;Limitations    Theraband Level (Shoulder Horizontal ABduction)  Level 3 (Green)    Horizontal ABduction Limitations  with back to wall for  postural support: cues on form and technique, 5 sec hold times with each rep    Extension  AROM;Strengthening;Both;10 reps;Theraband    Theraband Level (Shoulder Extension)  Level 3 (Green)    Extension Limitations  cues on posture and ex form, 5 sec holds each rep    Row  AROM;Strengthening;Both;10 reps;Theraband    Theraband Level (Shoulder Row)  Level 3 (Green)    Row Limitations  cues on posture and technique, 5 sec holds each rep    Other Standing Exercises  with back against doorframe for postural support: alternating diagonal pulls x 10 each way with cues on form/technqiue      Shoulder Exercises: Therapy Ball   Flexion  10 reps;Limitations    Flexion Limitations  cues on form and hold at top of each rep for flexion stretch    Other Therapy Ball Exercises  small ball rolling/circles x 10 each way with emphasis on scapular stability    Other Therapy  Ball Exercises  push ups with red pball x 10 reps with emphasis on posture and ex form      Shoulder Exercises: ROM/Strengthening   UBE (Upper Arm Bike)  level 1.5 x 4 minutes each forward/backward with cues/emphasis on posture for strengthening and       Shoulder Exercises: Stretch   Corner Stretch  3 reps;30 seconds;Limitations    Corner Stretch Limitations  cues for posture/hold time      Neck Exercises: Stretches   Upper Trapezius Stretch  3 reps;30 seconds;Limitations    Upper Trapezius Stretch Limitations  cues on technique and hold times    Levator Stretch  3 reps;30 seconds;Limitations    Levator Stretch Limitations  cues on technique and hold times         PT Long Term Goals - 04/12/17 1345      PT LONG TERM GOAL #1   Title  Pt will be independent with HEP for improved flexibility and decreased pain.  TARGET 05/10/17    Time  4    Period  Weeks    Status  New    Target Date  05/10/17      PT LONG TERM GOAL #2   Title  Pt will report pain reduction by 50% for sleeping activities.    Time  4    Period  Weeks    Status  New    Target Date  05/10/17      PT LONG TERM GOAL #3   Title  Pt will improve cervical flexion by at least 10 degrees for improved positioning in pain-free ranges.    Time  4    Period  Weeks    Status  New    Target Date  05/10/17      PT LONG TERM GOAL #4   Title  Pt will perform shoulder ROM WFL with no c/o pain, for improved positioning and mobility in pain-free ranges.    Time  4    Period  Weeks    Status  New    Target Date  05/10/17      PT LONG TERM GOAL #5   Title  Pt will verbalize understanding of posture/body mechanics with sleeping and ADLS for improved pain-free activities.    Time  4    Period  Weeks    Status  New    Target Date  05/10/17            Plan - 05/01/17 1154    Clinical Impression  Statement  Today's skilled session continued to focus on strengthening and flexibility of shoulder/neck/upper trunk with no  issues reported. Pt is progressing well towards goals, no pain at end of session. Pt should benefit from continued PT to progress toward unmet goals.     Rehab Potential  Good    PT Frequency  2x / week    PT Duration  4 weeks plus eval    PT Treatment/Interventions  ADLs/Self Care Home Management;Electrical Stimulation;Ultrasound;Traction;Moist Heat;Therapeutic activities;Therapeutic exercise;Patient/family education;Manual techniques;Passive range of motion;Iontophoresis 4mg /ml Dexamethasone    PT Next Visit Plan  exercises to address shoulder, neck ROM and flexibility    Consulted and Agree with Plan of Care  Patient       Patient will benefit from skilled therapeutic intervention in order to improve the following deficits and impairments:  Decreased range of motion, Decreased strength, Impaired flexibility, Impaired UE functional use, Improper body mechanics, Postural dysfunction, Pain  Visit Diagnosis: Acute pain of right shoulder  Abnormal posture  Muscle weakness (generalized)     Problem List There are no active problems to display for this patient.   Sallyanne Kuster, PTA, [facility] [address] [place], [place] [address] [phone] 05/01/17, 12:41 PM   Name: LAMOINE FREDRICKSEN MRN: [id number] Date of Birth: March 31, 1941

## 2017-05-03 ENCOUNTER — Ambulatory Visit: Payer: Medicare Other | Admitting: Physical Therapy

## 2017-05-03 ENCOUNTER — Encounter: Payer: Self-pay | Admitting: Physical Therapy

## 2017-05-03 DIAGNOSIS — R293 Abnormal posture: Secondary | ICD-10-CM | POA: Diagnosis not present

## 2017-05-03 DIAGNOSIS — M25511 Pain in right shoulder: Secondary | ICD-10-CM | POA: Diagnosis not present

## 2017-05-03 DIAGNOSIS — M6281 Muscle weakness (generalized): Secondary | ICD-10-CM

## 2017-05-03 NOTE — Therapy (Signed)
[facility] [address] [place], [place], [address] Phone: [phone]   Fax:  [phone]  Physical Therapy Treatment  Patient Details  Name: [name] MRN: [id number] Date of Birth: [birth year] Referring Provider: Renaye Rakers   Encounter Date: 05/03/2017  PT End of Session - 05/03/17 1306    Visit Number  7    Number of Visits  9    Date for PT Re-Evaluation  06/10/17    Authorization Type  Medicare, BCBS-GCODE every 10th visit    PT Start Time  0846    PT Stop Time  0930    PT Time Calculation (min)  44 min    Activity Tolerance  Patient tolerated treatment well Reports 1/10 pain at end of session    Behavior During Therapy  [facility] for tasks assessed/performed       History reviewed. No pertinent past medical history.  Past Surgical History:  Procedure Laterality Date  . CAPSULOTOMY Right 07/25/2012   Procedure: MINOR PROCEDURE YAG LASER CAPSULOTOMY;  Surgeon: Vita Erm., MD;  Location: [facility] OR;  Service: Ophthalmology;  Laterality: Right;  . CATARACT EXTRACTION W/PHACO Left 04/27/2015   Procedure: CATARACT EXTRACTION PHACO AND INTRAOCULAR LENS PLACEMENT (IOC) LEFT EYE;  Surgeon: Chalmers Guest, MD;  Location: [facility] OR;  Service: Ophthalmology;  Laterality: Left;  . COLONOSCOPY    . EYE SURGERY Right    Caract    There were no vitals filed for this visit.  Subjective Assessment - 05/03/17 0847    Subjective  Felt good Wednesday night without any pain, but last night, had to get up and rub oil on it to feel better.    Limitations  Other (comment) limits sleeping at night    Patient Stated Goals  Pt's goal for therapy is to get rid of pain.    Currently in Pain?  No/denies    Pain Onset  More than a month ago                      [facility] Adult PT Treatment/Exercise - 05/03/17 0001      Neck Exercises: Seated   X to V  10 reps;Limitations;Weight    X to V Weights (lbs)  1    X to  V Limitations  back against red pball at the wall: cues on posture and ex form, cues to maintain neutral cervical position    Shoulder Rolls  Backwards;10 reps;Limitations    Shoulder Rolls Limitations  with back agaist pball, holding 1# hand weights down by her side, cues on posture and sequencing.     Postural Training  Seated upper traps stretch, 3 reps R side, x 15-30 seconds    Other Seated Exercise  Seated posture stretch with hands supported on mat, slightly behind patient, 3 reps x 30 seconds      Shoulder Exercises: Standing   Horizontal ABduction  AROM;Strengthening;Both;10 reps;Theraband;Limitations    Theraband Level (Shoulder Horizontal ABduction)  Level 3 (Green)    Horizontal ABduction Limitations  with back to wall for postural support: cues on form and technique, 5 sec hold times with each rep    Extension  AROM;Strengthening;Both;10 reps;Theraband    Theraband Level (Shoulder Extension)  Level 3 (Green)    Extension Limitations  cues on posture and ex form, 5 sec holds each rep    Row  AROM;Strengthening;Both;10 reps;Theraband    Theraband Level (Shoulder Row)  Level 3 (Green)  Row Limitations  cues on posture and technique, 5 sec holds each rep    Other Standing Exercises  with back against doorframe for postural support: alternating diagonal pulls x 10 each way with cues on form/technqiue      Shoulder Exercises: Therapy Ball   Flexion  10 reps;Limitations    Flexion Limitations  cues on form and hold at top of each rep for flexion stretch    Other Therapy Ball Exercises  small ball rolling/circles x 10 each way with emphasis on scapular stability    Other Therapy Ball Exercises  Wall push-ups x 10 reps      Shoulder Exercises: Stretch   Corner Stretch  3 reps;30 seconds;Limitations    Corner Stretch Limitations  cues for posture/hold time performed at doorframe for better positioning        Cues throughout exercises for posture, body mechanics, to avoid guarding  of R shoulder.  At end of session:  Resisted R shoulder flexion elicits pain; pain with a/ROM R shoulder flexion          PT Long Term Goals - 04/12/17 1345      PT LONG TERM GOAL #1   Title  Pt will be independent with HEP for improved flexibility and decreased pain.  TARGET 05/10/17    Time  4    Period  Weeks    Status  New    Target Date  05/10/17      PT LONG TERM GOAL #2   Title  Pt will report pain reduction by 50% for sleeping activities.    Time  4    Period  Weeks    Status  New    Target Date  05/10/17      PT LONG TERM GOAL #3   Title  Pt will improve cervical flexion by at least 10 degrees for improved positioning in pain-free ranges.    Time  4    Period  Weeks    Status  New    Target Date  05/10/17      PT LONG TERM GOAL #4   Title  Pt will perform shoulder ROM WFL with no c/o pain, for improved positioning and mobility in pain-free ranges.    Time  4    Period  Weeks    Status  New    Target Date  05/10/17      PT LONG TERM GOAL #5   Title  Pt will verbalize understanding of posture/body mechanics with sleeping and ADLS for improved pain-free activities.    Time  4    Period  Weeks    Status  New    Target Date  05/10/17            Plan - 05/03/17 1306    Clinical Impression Statement  Continued to work on strengthening, flexibility of shoulder, neck, and upper trunk as in last visit; pt reports no pain after last visit, but trouble with sleeping last night.  Also pt has some wincing with discomfort during exercises.  Verbal and tactile cues provided for relaxed shoulder and fully straightened elbow, as pt tends to hike shoulder and keep R elbow flexed with exercises.  Pain noted with resisted shoulder flexion at end of session.  Discussed at end of session pt's overall improved pain, but continued pain at R shoulder (?biceps tendon) and along biceps tendon insertion.  May benefit from additional skilled therapy to addres positioning, pain,  exercises (discussed benefit of possible  transfer to [facility])    Rehab Potential  Good    PT Frequency  2x / week    PT Duration  4 weeks plus eval    PT Treatment/Interventions  ADLs/Self Care Home Management;Electrical Stimulation;Ultrasound;Traction;Moist Heat;Therapeutic activities;Therapeutic exercise;Patient/family education;Manual techniques;Passive range of motion;Iontophoresis 4mg /ml Dexamethasone    PT Next Visit Plan  Discuss possible transfer to [facility]; will need to schedule more appointments either here or at [facility]; address pain R shoulder/elbow area    Consulted and Agree with Plan of Care  Patient       Patient will benefit from skilled therapeutic intervention in order to improve the following deficits and impairments:  Decreased range of motion, Decreased strength, Impaired flexibility, Impaired UE functional use, Improper body mechanics, Postural dysfunction, Pain  Visit Diagnosis: Abnormal posture  Muscle weakness (generalized)     Problem List There are no active problems to display for this patient.   Waverly Chavarria W. 05/03/2017, 1:14 PM Gean Maidens., PT  [facility] [address] [place], [place], [address] Phone: [phone]   Fax:  [phone]  Name: [name] MRN: [id number] Date of Birth: March 14, 1941

## 2017-05-09 ENCOUNTER — Ambulatory Visit: Payer: Medicare Other | Admitting: Physical Therapy

## 2017-05-09 ENCOUNTER — Encounter: Payer: Self-pay | Admitting: Physical Therapy

## 2017-05-09 DIAGNOSIS — R293 Abnormal posture: Secondary | ICD-10-CM | POA: Diagnosis not present

## 2017-05-09 DIAGNOSIS — M6281 Muscle weakness (generalized): Secondary | ICD-10-CM

## 2017-05-09 DIAGNOSIS — M25511 Pain in right shoulder: Secondary | ICD-10-CM | POA: Diagnosis not present

## 2017-05-09 NOTE — Therapy (Signed)
[facility] [address] [place], [place], [address] Phone: [phone]   Fax:  [phone]  Physical Therapy Treatment  Patient Details  Name: CALIA NAPP MRN: [id number] Date of Birth: [birth year] Referring Provider: Lucianne Lei   Encounter Date: 05/09/2017  PT End of Session - 05/09/17 2041    Visit Number  8    Number of Visits  9    Date for PT Re-Evaluation  06/10/17    Authorization Type  Medicare, BCBS-GCODE every 10th visit    PT Start Time  0848    PT Stop Time  0929    PT Time Calculation (min)  41 min    Activity Tolerance  Patient tolerated treatment well    Behavior During Therapy  [facility] for tasks assessed/performed       History reviewed. No pertinent past medical history.  Past Surgical History:  Procedure Laterality Date  . CAPSULOTOMY Right 07/25/2012   Procedure: MINOR PROCEDURE YAG LASER CAPSULOTOMY;  Surgeon: Myrtha Mantis., MD;  Location: [place];  Service: Ophthalmology;  Laterality: Right;  . CATARACT EXTRACTION W/PHACO Left 04/27/2015   Procedure: CATARACT EXTRACTION PHACO AND INTRAOCULAR LENS PLACEMENT (IOC) LEFT EYE;  Surgeon: Marylynn Pearson, MD;  Location: [place];  Service: Ophthalmology;  Laterality: Left;  . COLONOSCOPY    . EYE SURGERY Right    Caract    There were no vitals filed for this visit.  Subjective Assessment - 05/09/17 0850    Subjective  Sat morning:  shoulder really hurt like a knife was in it; then Monday morning woke up with arm in pain; Tuesday and Wednesday morning no pain, little pain this morning.  Did have two good days in a row.    Limitations  Other (comment) limits sleeping at night    Patient Stated Goals  Pt's goal for therapy is to get rid of pain.    Currently in Pain?  Yes    Pain Score  1     Pain Location  Shoulder    Pain Orientation  Right    Pain Descriptors / Indicators  Aching    Pain Type  Acute pain    Pain Onset  More than a month  ago    Pain Frequency  Intermittent    Aggravating Factors   worse when she wakes up at night; sleeping on it wrong    Pain Relieving Factors  ice, rosemary oil, exercise/stretching         [facility] PT Assessment - 05/09/17 0857      ROM / Strength   AROM / PROM / Strength  AROM;Strength      AROM   Overall AROM Comments  R elbow ROM WNL    AROM Assessment Site  Cervical;Shoulder    Right/Left Shoulder  Right;Left    Right Shoulder Flexion  152 Degrees    Right Shoulder ABduction  154 Degrees    Left Shoulder Flexion  145 Degrees    Left Shoulder ABduction  136 Degrees    Cervical Flexion  20    Cervical Extension  25    Cervical - Right Rotation  45    Cervical - Left Rotation  56      Strength   Overall Strength  Deficits    Right/Left Shoulder  Right pain with resistance    Right Shoulder Flexion  3+/5  [facility] PT Treatment/Exercise - 05/09/17 0001      Self-Care   Self-Care  Other Self-Care Comments    Other Self-Care Comments   Discussed pt's overall pain improvements with sleeping.  Her shoulder/arm pain she rates now as a 3/10 with sleep versus 9/10 at eval.  Reviewed sleep positioning (she reports seeming to wake up with R shoulder above head, which seems to cause discomfort.  Discussed progress with goals, and plans to transfer to [facility] ortho to address more of the pain that is transient from R shoulder, into elbow area and sometimes into wrist.       Neck Exercises: Seated   Shoulder Rolls  Backwards;10 reps    Postural Training  Seated upper traps stretch, 3 reps R side, x 15-30 seconds    Other Seated Exercise  Seated posture stretch with hands supported on mat, slightly behind patient, 3 reps x 30 seconds      Neck Exercises: Supine   Neck Retraction  -- 2 sets      Shoulder Exercises: Supine   Other Supine Exercises  Shoulder press supine, (in decompression position), 2 sets x 10 reps      Shoulder Exercises: Standing    Retraction  Strengthening;Both;10 reps at doorframe    Other Standing Exercises  Standing at doorway, pec stretch, 3 reps x 30 seconds     Reviewed pt's full HEP, with pt return demo understanding.  Verbally cued patient at times to relax shoulder to avoid R shoulder elevation/hiking with flexion/abduction, also to relax R elbow and avoid holding in guarded elbow flexed position.          PT Education - 05/09/17 2040    Education provided  Yes    Education Details  Review of HEP, review posture/positioning; progress towards goals and plans to transition to [employer] to further address pain    Person(s) Educated  Patient    Methods  Explanation;Demonstration;Verbal cues    Comprehension  Verbalized understanding;Returned demonstration          PT Long Term Goals - 05/09/17 0854      PT LONG TERM GOAL #1   Title  Pt will be independent with HEP for improved flexibility and decreased pain.  TARGET 05/10/17    Time  4    Period  Weeks    Status  Achieved      PT LONG TERM GOAL #2   Title  Pt will report pain reduction by 50% for sleeping activities.    Baseline  rates pain at night as 3/10    Time  4    Period  Weeks    Status  Achieved      PT LONG TERM GOAL #3   Title  Pt will improve cervical flexion by at least 10 degrees for improved positioning in pain-free ranges.    Time  4    Period  Weeks    Status  Not Met      PT LONG TERM GOAL #4   Title  Pt will perform shoulder ROM WFL with no c/o pain, for improved positioning and mobility in pain-free ranges.    Baseline  pain occurs varied areas-R shoulder, R biceps tendon area, forearm    Time  4    Period  Weeks    Status  Partially Met      PT LONG TERM GOAL #5   Title  Pt will verbalize understanding of posture/body mechanics with sleeping and ADLS  for improved pain-free activities.    Time  4    Period  Weeks    Status  Achieved            Plan - 05/09/17 2044    Clinical Impression Statement   Pt has met LTG 1,2, 5.  LTG 3 not met, but did increase cervical flexion by 5 degrees; LTG 4 partially met, with improved shoulder ROM, but continued pain.  Pt varies in position at R shoulder area, and into biceps tendon insertion at times, and today pt notes some pain in forearm.   Pt seems transient with movements of shoulder flexion and with resisted shoulder flexion.  Overall, pt's pain has decreased, but pt continues to be awakened by pain at night (continues to report malpositioning with waking up with RUE overhead and painful).  Pt may benefit from further work-up from orthopedic PT at [facility] and is agreeable to this.  Plan transfer there for next session.    Rehab Potential  Good    PT Frequency  2x / week    PT Duration  4 weeks plus eval    PT Treatment/Interventions  ADLs/Self Care Home Management;Electrical Stimulation;Ultrasound;Traction;Moist Heat;Therapeutic activities;Therapeutic exercise;Patient/family education;Manual techniques;Passive range of motion;Iontophoresis 74m/ml Dexamethasone    PT Next Visit Plan  Will need renewal/recert for updated POC; address continued pain in R shoulder, arm with shoulder flexion/resisted flexion    Consulted and Agree with Plan of Care  Patient       Patient will benefit from skilled therapeutic intervention in order to improve the following deficits and impairments:  Decreased range of motion, Decreased strength, Impaired flexibility, Impaired UE functional use, Improper body mechanics, Postural dysfunction, Pain  Visit Diagnosis: Acute pain of right shoulder  Muscle weakness (generalized)     Problem List There are no active problems to display for this patient.   Alonah Lineback W. 05/09/2017, 8:49 PM  MFrazier Butt, PT   [address] [address]Phone: [phone]  Fax:  [phone] Name: CTAIJAH MACRAEMRN:  [id number] of Birth: 712-07-1940

## 2017-05-14 ENCOUNTER — Encounter: Payer: Self-pay | Admitting: Physical Therapy

## 2017-05-14 ENCOUNTER — Ambulatory Visit: Payer: Medicare Other | Admitting: Physical Therapy

## 2017-05-14 DIAGNOSIS — M6281 Muscle weakness (generalized): Secondary | ICD-10-CM

## 2017-05-14 DIAGNOSIS — M25511 Pain in right shoulder: Secondary | ICD-10-CM | POA: Diagnosis not present

## 2017-05-14 DIAGNOSIS — R293 Abnormal posture: Secondary | ICD-10-CM

## 2017-05-14 NOTE — Patient Instructions (Addendum)

## 2017-05-14 NOTE — Therapy (Signed)
[place], [place], [address] Phone: [phone]   Fax:  [phone]  Physical Therapy Treatment / Re-certification  Patient Details  Name: [name] MRN: [id number] Date of Birth: Jan 06, 1941 Referring Provider: Lucianne Lei   Encounter Date: 05/14/2017  PT End of Session - 05/14/17 1012    Visit Number  9    Number of Visits  17    Date for PT Re-Evaluation  06/10/17    Authorization Type  Medicare, Kx mod by 15th visit    PT Start Time  1012    PT Stop Time  1058    PT Time Calculation (min)  46 min    Activity Tolerance  Patient tolerated treatment well    Behavior During Therapy  [facility] for tasks assessed/performed       History reviewed. No pertinent past medical history.  Past Surgical History:  Procedure Laterality Date  . CAPSULOTOMY Right 07/25/2012   Procedure: MINOR PROCEDURE YAG LASER CAPSULOTOMY;  Surgeon: Myrtha Mantis., MD;  Location: [place];  Service: Ophthalmology;  Laterality: Right;  . CATARACT EXTRACTION W/PHACO Left 04/27/2015   Procedure: CATARACT EXTRACTION PHACO AND INTRAOCULAR LENS PLACEMENT (IOC) LEFT EYE;  Surgeon: Marylynn Pearson, MD;  Location: [place];  Service: Ophthalmology;  Laterality: Left;  . COLONOSCOPY    . EYE SURGERY Right    Caract    There were no vitals filed for this visit.  Subjective Assessment - 05/14/17 1011    Subjective  pt rpeorts continued pain in the R shoulder and elbow that is worse when she is laying down a night. I think it is getting better but I am just not sure what caused it.     Currently in Pain?  Yes    Pain Location  Shoulder    Pain Descriptors / Indicators  Aching    Pain Type  Chronic pain    Pain Onset  More than a month ago    Aggravating Factors   laying down, pulling covers with RUE, Raising    Pain Relieving Factors  patches, heating, ice, walking around the gym. massages         [facility] PT Assessment - 05/14/17  1019      Assessment   Referring Provider  Lucianne Lei    Hand Dominance  Right      Posture/Postural Control   Posture/Postural Control  Postural limitations    Postural Limitations  Forward head;Rounded Shoulders      AROM   Right Shoulder Flexion  158 Degrees    Right Shoulder ABduction  130 Degrees    Cervical Flexion  40    Cervical Extension  40    Cervical - Right Side Bend  30    Cervical - Left Side Bend  20    Cervical - Right Rotation  54    Cervical - Left Rotation  64      Strength   Strength Assessment Site  Hand    Right Shoulder Flexion  3+/5    Right Shoulder ABduction  4/5    Left Shoulder Flexion  4+/5    Left Shoulder ABduction  4/5    Right Hand Grip (lbs)  64.6 71,24,58    Left Hand Grip (lbs)  60.3 58,64,59                  [facility] Adult PT Treatment/Exercise - 05/14/17 1019      Neck Exercises: Supine  Neck Retraction  10 reps;5 secs given as HEP, verbal cues for proper form      Shoulder Exercises: Supine   Protraction  Both;10 reps holding 3 sec, tactile cues for proper form      Shoulder Exercises: Standing   Other Standing Exercises  lower trap Y's 2 x 10 demonstration for proper form      Manual Therapy   Manual therapy comments  MTPR along R upper trap, levator scap and middle scalenes      Neck Exercises: Stretches   Upper Trapezius Stretch  2 reps;30 seconds    Levator Stretch  2 reps;30 seconds             PT Education - 05/14/17 1100    Education Details  reviewed previously provided HEP and anatomy regarding shoulder, updated HEP for the neck/ shoulder    Person(s) Educated  Patient    Methods  Explanation;Verbal cues;Handout    Comprehension  Verbalized understanding;Verbal cues required          PT Long Term Goals - 05/14/17 1028      PT LONG TERM GOAL #1   Title  Pt will be independent with HEP for improved flexibility and decreased pain.      Time  4    Period  Weeks    Status  Achieved      PT  LONG TERM GOAL #2   Title  Pt will report pain reduction by 50% for sleeping activities.    Time  4    Period  Weeks    Status  Achieved      PT LONG TERM GOAL #3   Title  Pt will improve cervical flexion by at least 10 degrees for improved positioning in pain-free ranges.    Time  4    Period  Weeks    Status  Achieved      PT LONG TERM GOAL #4   Title  Pt will perform shoulder ROM WFL with no c/o pain, for improved positioning and mobility in pain-free ranges.    Time  4    Period  Weeks    Status  Partially Met      PT LONG TERM GOAL #5   Title  Pt will verbalize understanding of posture/body mechanics with sleeping and ADLS for improved pain-free activities.    Period  Weeks    Status  Achieved      Additional Long Term Goals   Additional Long Term Goals  Yes      PT LONG TERM GOAL #6   Title  improve R shoulder Strength to >/=4/5 in all planes to promote stability with lifting and carrying activities while demonstrating proper posture/ mechanics    Time  4    Period  Weeks    Status  New    Target Date  06/11/17            Plan - 05/14/17 1103    Clinical Impression Statement  pt reports minimal neck and shoulder pain today with intermittent referral the medial elbow. improvement of the shoulder ROM and cervical ROM compared to inital measures. discussed DN which pt opted to hold off until the next visit. following manual techniques and scapular stability execises she reported decreased pain and stiffness. She would benefit from continued physical therapy to increase shoulder function, reduce pain and increase strength by addressing the deficits listed below.     PT Frequency  2x / week  PT Duration  4 weeks    PT Treatment/Interventions  ADLs/Self Care Home Management;Electrical Stimulation;Ultrasound;Traction;Moist Heat;Therapeutic activities;Therapeutic exercise;Patient/family education;Manual techniques;Passive range of motion;Iontophoresis 83m/ml  Dexamethasone;Dry needling;Taping;Neuromuscular re-education    PT Next Visit Plan  address continued pain in R shoulder, arm with shoulder flexion/resisted flexion, scapular stability, DN? thoracic mobility    PT Home Exercise Plan  lower trap Y's, upper trap stretching, chin tuck, ceiling punches    Consulted and Agree with Plan of Care  Patient       Patient will benefit from skilled therapeutic intervention in order to improve the following deficits and impairments:  Decreased range of motion, Decreased strength, Impaired flexibility, Impaired UE functional use, Improper body mechanics, Postural dysfunction, Pain  Visit Diagnosis: Acute pain of right shoulder  Muscle weakness (generalized)  Abnormal posture     Problem List There are no active problems to display for this patient.  KStarr LakePT, DPT, LAT, ATC  05/14/17  11:19 AM      [facility] System1989[address]GParad[place] [address]Phone: [phone]  Fax:  [phone] Name: CTAMMRA PRESSMANMRN: [id number] of Birth: 707/17/1942

## 2017-05-15 DIAGNOSIS — L818 Other specified disorders of pigmentation: Secondary | ICD-10-CM | POA: Diagnosis not present

## 2017-05-15 DIAGNOSIS — L900 Lichen sclerosus et atrophicus: Secondary | ICD-10-CM | POA: Diagnosis not present

## 2017-05-22 ENCOUNTER — Encounter: Payer: Self-pay | Admitting: Physical Therapy

## 2017-05-22 ENCOUNTER — Ambulatory Visit: Payer: Medicare Other | Admitting: Physical Therapy

## 2017-05-22 DIAGNOSIS — M6281 Muscle weakness (generalized): Secondary | ICD-10-CM

## 2017-05-22 DIAGNOSIS — M25511 Pain in right shoulder: Secondary | ICD-10-CM

## 2017-05-22 DIAGNOSIS — R293 Abnormal posture: Secondary | ICD-10-CM | POA: Diagnosis not present

## 2017-05-22 NOTE — Therapy (Signed)
[place], [place], [address] Phone: [phone]   Fax:  [phone]  Physical Therapy Treatment  Patient Details  Name: [name] MRN: [id number] Date of Birth: [birth year] Referring Provider: Lucianne Lei   Encounter Date: 05/22/2017  PT End of Session - 05/22/17 0951    Visit Number  10    Number of Visits  17    Date for PT Re-Evaluation  06/10/17    Authorization Type  Medicare, Kx mod by 15th visit    PT Start Time  0924    PT Stop Time  1012    PT Time Calculation (min)  48 min    Activity Tolerance  Patient tolerated treatment well    Behavior During Therapy  [facility] for tasks assessed/performed       History reviewed. No pertinent past medical history.  Past Surgical History:  Procedure Laterality Date  . CAPSULOTOMY Right 07/25/2012   Procedure: MINOR PROCEDURE YAG LASER CAPSULOTOMY;  Surgeon: Myrtha Mantis., MD;  Location: [place];  Service: Ophthalmology;  Laterality: Right;  . CATARACT EXTRACTION W/PHACO Left 04/27/2015   Procedure: CATARACT EXTRACTION PHACO AND INTRAOCULAR LENS PLACEMENT (IOC) LEFT EYE;  Surgeon: Marylynn Pearson, MD;  Location: [place];  Service: Ophthalmology;  Laterality: Left;  . COLONOSCOPY    . EYE SURGERY Right    Caract    There were no vitals filed for this visit.  Subjective Assessment - 05/22/17 0923    Subjective  "I am feeling much better today, I am unsure if I need the dry needling today. I was able to pull the covers up and it didn't hurt"     Currently in Pain?  Yes    Pain Score  1     Pain Location  Shoulder    Pain Descriptors / Indicators  Sore    Pain Type  Chronic pain    Pain Onset  More than a month ago    Pain Frequency  Intermittent    Aggravating Factors   laying, pulling the covers up and over    Pain Relieving Factors  heat, ice, walking,                       [facility] Adult PT Treatment/Exercise - 05/22/17 0945      Shoulder Exercises: Supine   Protraction  Both;10 reps;Weights using dowel rod with 5#, demonstration for form    Other Supine Exercises  scapular retraction with ER 2 x 10  with red theraband      Shoulder Exercises: Seated   Other Seated Exercises  lower trap strengthening 2 x10 with elbows on bolster using red theraband      Shoulder Exercises: Standing   Other Standing Exercises  2 x 10 scaption with 1#  verbal cues/ demonstration for proper form      Shoulder Exercises: ROM/Strengthening   Other ROM/Strengthening Exercises  shoulder flexion / abduction 2 x 10  combined with manual scapular upward assist      Manual Therapy   Manual Therapy  Scapular mobilization    Manual therapy comments  MTPR along R upper trap, levator scap and middle scalenes    Joint Mobilization  R first rib inferior mob grade 3 with pt focused on breathg in/out deeply    Scapular Mobilization  scapular upward assist with active flexion/ abduction execise      Neck Exercises: Stretches   Upper Trapezius Stretch  2 reps;30 seconds             PT Education - 05/22/17 1011    Education provided  Yes    Education Details  anatom of the shoulder and biomechanics     Person(s) Educated  Patient    Methods  Explanation;Verbal cues using skeleton    Comprehension  Verbalized understanding;Verbal cues required          PT Long Term Goals - 05/14/17 1028      PT LONG TERM GOAL #1   Title  Pt will be independent with HEP for improved flexibility and decreased pain.      Time  4    Period  Weeks    Status  Achieved      PT LONG TERM GOAL #2   Title  Pt will report pain reduction by 50% for sleeping activities.    Time  4    Period  Weeks    Status  Achieved      PT LONG TERM GOAL #3   Title  Pt will improve cervical flexion by at least 10 degrees for improved positioning in pain-free ranges.    Time  4    Period  Weeks    Status  Achieved      PT LONG TERM GOAL #4   Title  Pt will  perform shoulder ROM WFL with no c/o pain, for improved positioning and mobility in pain-free ranges.    Time  4    Period  Weeks    Status  Partially Met      PT LONG TERM GOAL #5   Title  Pt will verbalize understanding of posture/body mechanics with sleeping and ADLS for improved pain-free activities.    Period  Weeks    Status  Achieved      Additional Long Term Goals   Additional Long Term Goals  Yes      PT LONG TERM GOAL #6   Title  improve R shoulder Strength to >/=4/5 in all planes to promote stability with lifting and carrying activities while demonstrating proper posture/ mechanics    Time  4    Period  Weeks    Status  New    Target Date  06/11/17            Plan - 05/22/17 0951    Clinical Impression Statement  pt reports significant improvement since last session. continued working on scapular stability with emphasis on muscles to promote scapulohumeral rhythm to prevent potential impingement. she required demonstration for exericse but reported no pain with raising her arm post session.     PT Treatment/Interventions  ADLs/Self Care Home Management;Electrical Stimulation;Ultrasound;Traction;Moist Heat;Therapeutic activities;Therapeutic exercise;Patient/family education;Manual techniques;Passive range of motion;Iontophoresis 69m/ml Dexamethasone;Dry needling;Taping;Neuromuscular re-education    PT Next Visit Plan  address continued pain in R shoulder, arm with shoulder flexion/resisted flexion, scapular stability, DN? thoracic mobility,     PT Home Exercise Plan  lower trap Y's, upper trap stretching, chin tuck, ceiling punches       Patient will benefit from skilled therapeutic intervention in order to improve the following deficits and impairments:  Decreased range of motion, Decreased strength, Impaired flexibility, Impaired UE functional use, Improper body mechanics, Postural dysfunction, Pain  Visit Diagnosis: Acute pain of right shoulder  Muscle weakness  (generalized)  Abnormal posture     Problem List There are no active problems to display for this patient.  Kristoffer Leamon PT, DPT, LAT, ATC  05/22/17  10:14  AM      [facility] [address] [place], [place], [address] Phone: [phone]   Fax:  [phone]  Name: NAVPREET SZCZYGIEL MRN: [id number] Date of Birth: [birth year]

## 2017-05-24 ENCOUNTER — Ambulatory Visit: Payer: Medicare Other | Admitting: Physical Therapy

## 2017-05-24 ENCOUNTER — Encounter: Payer: Self-pay | Admitting: Physical Therapy

## 2017-05-24 DIAGNOSIS — R293 Abnormal posture: Secondary | ICD-10-CM

## 2017-05-24 DIAGNOSIS — M6281 Muscle weakness (generalized): Secondary | ICD-10-CM | POA: Diagnosis not present

## 2017-05-24 DIAGNOSIS — M25511 Pain in right shoulder: Secondary | ICD-10-CM

## 2017-05-24 NOTE — Therapy (Signed)
[place], [place], [address] Phone: [phone]   Fax:  [phone]  Physical Therapy Treatment  Patient Details  Name: [name] MRN: [id number] Date of Birth: 1941-01-04 Referring Provider: Lucianne Lei   Encounter Date: 05/24/2017  PT End of Session - 05/24/17 1136    Visit Number  11    Number of Visits  17    Date for PT Re-Evaluation  06/10/17    PT Start Time  1050    PT Stop Time  1132    PT Time Calculation (min)  42 min    Activity Tolerance  Patient tolerated treatment well    Behavior During Therapy  [facility] for tasks assessed/performed       History reviewed. No pertinent past medical history.  Past Surgical History:  Procedure Laterality Date  . CAPSULOTOMY Right 07/25/2012   Procedure: MINOR PROCEDURE YAG LASER CAPSULOTOMY;  Surgeon: Myrtha Mantis., MD;  Location: [place];  Service: Ophthalmology;  Laterality: Right;  . CATARACT EXTRACTION W/PHACO Left 04/27/2015   Procedure: CATARACT EXTRACTION PHACO AND INTRAOCULAR LENS PLACEMENT (IOC) LEFT EYE;  Surgeon: Marylynn Pearson, MD;  Location: [place];  Service: Ophthalmology;  Laterality: Left;  . COLONOSCOPY    . EYE SURGERY Right    Caract    There were no vitals filed for this visit.                   [place] Adult PT Treatment/Exercise - 05/24/17 1137      Shoulder Exercises: Supine   Other Supine Exercises  bicep curls 2 x 10 with controlled eccentric lowering ( 1 set with red band, 1 set with green band)      Shoulder Exercises: Prone   Other Prone Exercises  I's T's and Y's 1# 1 x 10 ea. direction      Shoulder Exercises: ROM/Strengthening   UBE (Upper Arm Bike)  L1 x 4 min changing direction at 2 min      Shoulder Exercises: Stretch   Other Shoulder Stretches  3 way bicep stretch holding ea. 30 sec x 2 reps per position      Manual Therapy   Manual therapy comments  skilled palpation and monitoring pt  throughout DN    Soft tissue mobilization  IASTM along mid/ distal bicep brachii      Neck Exercises: Stretches   Upper Trapezius Stretch  2 reps;30 seconds       Trigger Point Dry Needling - 05/24/17 1136    Consent Given?  Yes    Education Handout Provided  Yes    Muscles Treated Upper Body  -- bicep brachii R  only           PT Education - 05/24/17 1135    Education provided  Yes    Education Details  reviewed DN and benefits/ what to expect. Updated HEP for bicep stretching.     Person(s) Educated  Patient    Methods  Explanation;Verbal cues    Comprehension  Verbalized understanding;Verbal cues required          PT Long Term Goals - 05/14/17 1028      PT LONG TERM GOAL #1   Title  Pt will be independent with HEP for improved flexibility and decreased pain.      Time  4    Period  Weeks    Status  Achieved      PT LONG TERM GOAL #2  Title  Pt will report pain reduction by 50% for sleeping activities.    Time  4    Period  Weeks    Status  Achieved      PT LONG TERM GOAL #3   Title  Pt will improve cervical flexion by at least 10 degrees for improved positioning in pain-free ranges.    Time  4    Period  Weeks    Status  Achieved      PT LONG TERM GOAL #4   Title  Pt will perform shoulder ROM WFL with no c/o pain, for improved positioning and mobility in pain-free ranges.    Time  4    Period  Weeks    Status  Partially Met      PT LONG TERM GOAL #5   Title  Pt will verbalize understanding of posture/body mechanics with sleeping and ADLS for improved pain-free activities.    Period  Weeks    Status  Achieved      Additional Long Term Goals   Additional Long Term Goals  Yes      PT LONG TERM GOAL #6   Title  improve R shoulder Strength to >/=4/5 in all planes to promote stability with lifting and carrying activities while demonstrating proper posture/ mechanics    Time  4    Period  Weeks    Status  New    Target Date  06/11/17             Plan - 05/24/17 1141    Clinical Impression Statement  pt reports minimal soreness today and would like to try DN. DN was re-explained and performed along the mid-distal bicep brachii. Followed DN with IASTM techniques which she reported reduced pain. continued shoulder stability execise and updated HEP for bicep stretching. post session she reported no pain.     PT Treatment/Interventions  ADLs/Self Care Home Management;Electrical Stimulation;Ultrasound;Traction;Moist Heat;Therapeutic activities;Therapeutic exercise;Patient/family education;Manual techniques;Passive range of motion;Iontophoresis 29m/ml Dexamethasone;Dry needling;Taping;Neuromuscular re-education    PT Next Visit Plan  response to DN, arm with shoulder flexion/resisted flexion, scapular stability, thoracic mobility,     PT Home Exercise Plan  lower trap Y's, upper trap stretching, chin tuck, ceiling punches, 3 way bicep stretch    Consulted and Agree with Plan of Care  Patient       Patient will benefit from skilled therapeutic intervention in order to improve the following deficits and impairments:  Decreased range of motion, Decreased strength, Impaired flexibility, Impaired UE functional use, Improper body mechanics, Postural dysfunction, Pain  Visit Diagnosis: No diagnosis found.     Problem List There are no active problems to display for this patient.  KStarr LakePT, DPT, LAT, ATC  05/24/17  11:45 AM      [facility]14[address]GMason [address] [address]Phone: [phone]  Fax:  [phone] Name: Yolanda HEIMANNMRN: [id number] of Birth: 712/13/1942

## 2017-05-28 ENCOUNTER — Ambulatory Visit: Payer: Medicare Other | Admitting: Physical Therapy

## 2017-05-30 ENCOUNTER — Ambulatory Visit: Payer: Medicare Other | Admitting: Physical Therapy

## 2017-06-04 ENCOUNTER — Ambulatory Visit: Payer: Medicare Other | Attending: Family Medicine | Admitting: Physical Therapy

## 2017-06-04 ENCOUNTER — Encounter: Payer: Self-pay | Admitting: Physical Therapy

## 2017-06-04 DIAGNOSIS — R293 Abnormal posture: Secondary | ICD-10-CM | POA: Diagnosis not present

## 2017-06-04 DIAGNOSIS — M25511 Pain in right shoulder: Secondary | ICD-10-CM | POA: Insufficient documentation

## 2017-06-04 DIAGNOSIS — M6281 Muscle weakness (generalized): Secondary | ICD-10-CM | POA: Diagnosis not present

## 2017-06-04 NOTE — Therapy (Signed)
[place], [place], [address] Phone: [phone]   Fax:  [phone]  Physical Therapy Treatment / Discharge Summary   Patient Details  Name: PENDA VENTURI MRN: [id number] Date of Birth: [birth year] Referring Provider: Lucianne Lei   Encounter Date: 06/04/2017  PT End of Session - 06/04/17 1213    Visit Number  12    Number of Visits  17    Date for PT Re-Evaluation  06/10/17    Authorization Type  Medicare, Kx mod by 15th visit    PT Start Time  1059    PT Stop Time  1145    PT Time Calculation (min)  46 min    Activity Tolerance  Patient tolerated treatment well    Behavior During Therapy  [facility] for tasks assessed/performed       History reviewed. No pertinent past medical history.  Past Surgical History:  Procedure Laterality Date  . CAPSULOTOMY Right 07/25/2012   Procedure: MINOR PROCEDURE YAG LASER CAPSULOTOMY;  Surgeon: Myrtha Mantis., MD;  Location: [facility];  Service: Ophthalmology;  Laterality: Right;  . CATARACT EXTRACTION W/PHACO Left 04/27/2015   Procedure: CATARACT EXTRACTION PHACO AND INTRAOCULAR LENS PLACEMENT (IOC) LEFT EYE;  Surgeon: Marylynn Pearson, MD;  Location: [place];  Service: Ophthalmology;  Laterality: Left;  . COLONOSCOPY    . EYE SURGERY Right    Caract    There were no vitals filed for this visit.  Subjective Assessment - 06/04/17 1102    Subjective  "I was sick since the last session but I am feeling much better today. I don't feel the pain right now, I did feel a small amount of soreness but it went away"    Currently in Pain?  No/denies         [facility] PT Assessment - 06/04/17 1118      AROM   Right Shoulder Flexion  160 Degrees    Right Shoulder ABduction  134 Degrees    Cervical Flexion  42    Cervical Extension  50    Cervical - Right Side Bend  30    Cervical - Left Side Bend  30    Cervical - Right Rotation  63    Cervical - Left Rotation  72      Strength   Right Shoulder Flexion  4/5    Right Shoulder ABduction  4+/5    Left Shoulder Flexion  4+/5    Left Shoulder ABduction  4+/5                  [facility] Adult PT Treatment/Exercise - 06/04/17 1109      Neck Exercises: Machines for Strengthening   Other Machines for Strengthening  3 way bicep stretch 30 sec hold       Shoulder Exercises: Supine   Other Supine Exercises  bicep curls 2 x 10 with controlled eccentric lowering ( 1 set with red band, 1 set with green band)      Shoulder Exercises: Seated   Other Seated Exercises  lower trap strengthening 2 x10 with elbows on bolster using red theraband      Shoulder Exercises: Standing   Other Standing Exercises  2 x 10 scaption with 1#       Shoulder Exercises: ROM/Strengthening   UBE (Upper Arm Bike)  L1 x 6 min forward 3 min/ backward 3 min      Shoulder Exercises: Stretch   Other Shoulder Stretches  3 way bicep stretch holding ea. 30 sec x 2 reps per position             PT Education - 06/04/17 1212    Education provided  Yes    Education Details  reviewed previoulsy provided, and how to progress strengthening in order to maintain and promote current level of function. reviewed posture    Person(s) Educated  Patient    Methods  Explanation;Verbal cues;Handout    Comprehension  Verbalized understanding;Verbal cues required          PT Long Term Goals - 06/04/17 1121      PT LONG TERM GOAL #1   Title  Pt will be independent with HEP for improved flexibility and decreased pain.      Time  4    Period  Weeks    Status  Achieved      PT LONG TERM GOAL #2   Title  Pt will report pain reduction by 50% for sleeping activities.    Time  4    Period  Weeks    Status  Achieved      PT LONG TERM GOAL #3   Title  Pt will improve cervical flexion by at least 10 degrees for improved positioning in pain-free ranges.    Time  4    Period  Weeks    Status  Achieved      PT LONG TERM GOAL #4   Title   Pt will perform shoulder ROM WFL with no c/o pain, for improved positioning and mobility in pain-free ranges.    Time  4    Period  Weeks    Status  Achieved      PT LONG TERM GOAL #5   Title  Pt will verbalize understanding of posture/body mechanics with sleeping and ADLS for improved pain-free activities.    Period  Weeks    Status  Achieved      PT LONG TERM GOAL #6   Title  improve R shoulder Strength to >/=4/5 in all planes to promote stability with lifting and carrying activities while demonstrating proper posture/ mechanics    Time  4    Period  Weeks    Status  Achieved            Plan - 06/04/17 1214    Clinical Impression Statement  mrs. Rodriges has made great progress with physical therapy improving both shoulder mobility/ strength, cervical mobility and additionally reporting no pain. reported only occasional minimal soreness inthe bicep but is fleeting with exercises/ stretching. She met or partially met all goals, she is able to maintain and progress her current level of function independently and will be discharged from PT today.     PT Next Visit Plan  d/C    PT Home Exercise Plan  lower trap Y's, upper trap stretching, chin tuck, ceiling punches, 3 way bicep stretch    Consulted and Agree with Plan of Care  Patient       Patient will benefit from skilled therapeutic intervention in order to improve the following deficits and impairments:  Decreased range of motion, Decreased strength, Impaired flexibility, Impaired UE functional use, Improper body mechanics, Postural dysfunction, Pain  Visit Diagnosis: Acute pain of right shoulder  Muscle weakness (generalized)  Abnormal posture     Problem List There are no active problems to display for this patient.  Baker Moronta PT, DPT, LAT, ATC  06/04/17  12:29 PM  [place], [place], [address] Phone: [phone]   Fax:   [phone]  Name: NEALIE MCHATTON MRN: [id number] Date of Birth: Jul 08, 1940        PHYSICAL THERAPY DISCHARGE SUMMARY  Visits from Start of Care: 12  Current functional level related to goals / functional outcomes: See goals   Remaining deficits: Intermittent distal bicep soreness controlled with stretching/ exercise. See above assessment.   Education / Equipment: HEP, theraband, posture  Plan: Patient agrees to discharge.  Patient goals were partially met. Patient is being discharged due to being pleased with the current functional level.  ?????         Heer Justiss PT, DPT, LAT, ATC  06/04/17  12:30 PM

## 2017-06-06 ENCOUNTER — Encounter: Payer: PRIVATE HEALTH INSURANCE | Admitting: Physical Therapy

## 2017-06-11 ENCOUNTER — Encounter: Payer: PRIVATE HEALTH INSURANCE | Admitting: Physical Therapy

## 2017-06-13 ENCOUNTER — Encounter: Payer: PRIVATE HEALTH INSURANCE | Admitting: Physical Therapy

## 2017-10-15 DIAGNOSIS — H04123 Dry eye syndrome of bilateral lacrimal glands: Secondary | ICD-10-CM | POA: Diagnosis not present

## 2017-10-15 DIAGNOSIS — H43813 Vitreous degeneration, bilateral: Secondary | ICD-10-CM | POA: Diagnosis not present

## 2017-11-26 DIAGNOSIS — G4730 Sleep apnea, unspecified: Secondary | ICD-10-CM | POA: Diagnosis not present

## 2017-11-26 DIAGNOSIS — I10 Essential (primary) hypertension: Secondary | ICD-10-CM | POA: Diagnosis not present

## 2017-11-26 DIAGNOSIS — I8311 Varicose veins of right lower extremity with inflammation: Secondary | ICD-10-CM | POA: Diagnosis not present

## 2017-11-26 DIAGNOSIS — N189 Chronic kidney disease, unspecified: Secondary | ICD-10-CM | POA: Diagnosis not present

## 2017-11-26 DIAGNOSIS — G5603 Carpal tunnel syndrome, bilateral upper limbs: Secondary | ICD-10-CM | POA: Diagnosis not present

## 2017-11-26 DIAGNOSIS — E782 Mixed hyperlipidemia: Secondary | ICD-10-CM | POA: Diagnosis not present

## 2017-11-27 DIAGNOSIS — I10 Essential (primary) hypertension: Secondary | ICD-10-CM | POA: Diagnosis not present

## 2017-11-27 DIAGNOSIS — E782 Mixed hyperlipidemia: Secondary | ICD-10-CM | POA: Diagnosis not present

## 2017-11-27 DIAGNOSIS — M130 Polyarthritis, unspecified: Secondary | ICD-10-CM | POA: Diagnosis not present

## 2018-02-20 ENCOUNTER — Encounter (HOSPITAL_COMMUNITY): Payer: Self-pay | Admitting: Emergency Medicine

## 2018-02-20 ENCOUNTER — Other Ambulatory Visit: Payer: Self-pay

## 2018-02-20 ENCOUNTER — Emergency Department (HOSPITAL_COMMUNITY): Payer: Medicare Other

## 2018-02-20 ENCOUNTER — Observation Stay (HOSPITAL_COMMUNITY)
Admission: EM | Admit: 2018-02-20 | Discharge: 2018-02-21 | Disposition: A | Discharge status: Home / Self Care | Payer: Medicare Other | Attending: Internal Medicine | Admitting: Internal Medicine

## 2018-02-20 DIAGNOSIS — Z79899 Other long term (current) drug therapy: Secondary | ICD-10-CM | POA: Diagnosis not present

## 2018-02-20 DIAGNOSIS — Z7982 Long term (current) use of aspirin: Secondary | ICD-10-CM | POA: Diagnosis not present

## 2018-02-20 DIAGNOSIS — R55 Syncope and collapse: Secondary | ICD-10-CM | POA: Diagnosis not present

## 2018-02-20 DIAGNOSIS — I959 Hypotension, unspecified: Secondary | ICD-10-CM | POA: Diagnosis not present

## 2018-02-20 DIAGNOSIS — N289 Disorder of kidney and ureter, unspecified: Secondary | ICD-10-CM | POA: Diagnosis not present

## 2018-02-20 DIAGNOSIS — Z87891 Personal history of nicotine dependence: Secondary | ICD-10-CM | POA: Diagnosis not present

## 2018-02-20 DIAGNOSIS — R739 Hyperglycemia, unspecified: Secondary | ICD-10-CM | POA: Diagnosis not present

## 2018-02-20 DIAGNOSIS — N182 Chronic kidney disease, stage 2 (mild): Secondary | ICD-10-CM | POA: Diagnosis not present

## 2018-02-20 DIAGNOSIS — R531 Weakness: Secondary | ICD-10-CM | POA: Diagnosis not present

## 2018-02-20 DIAGNOSIS — M25511 Pain in right shoulder: Secondary | ICD-10-CM

## 2018-02-20 DIAGNOSIS — R61 Generalized hyperhidrosis: Secondary | ICD-10-CM | POA: Diagnosis not present

## 2018-02-20 DIAGNOSIS — N179 Acute kidney failure, unspecified: Secondary | ICD-10-CM | POA: Diagnosis not present

## 2018-02-20 HISTORY — DX: Chronic kidney disease, unspecified: N18.9

## 2018-02-20 LAB — BASIC METABOLIC PANEL
Anion gap: 8 (ref 5–15)
BUN: 16 mg/dL (ref 8–23)
CALCIUM: 9.2 mg/dL (ref 8.9–10.3)
CHLORIDE: 106 mmol/L (ref 98–111)
CO2: 24 mmol/L (ref 22–32)
CREATININE: 1.26 mg/dL — AB (ref 0.44–1.00)
GFR calc non Af Amer: 39 mL/min — ABNORMAL LOW (ref >60)
GFR, EST AFRICAN AMERICAN: 46 mL/min — AB (ref >60)
Glucose, Bld: 200 mg/dL — ABNORMAL HIGH (ref 70–99)
Potassium: 3.6 mmol/L (ref 3.5–5.1)
SODIUM: 138 mmol/L (ref 135–145)

## 2018-02-20 LAB — CBC
HEMATOCRIT: 39.5 % (ref 36.0–46.0)
Hemoglobin: 12.4 g/dL (ref 12.0–15.0)
MCH: 27.4 pg (ref 26.0–34.0)
MCHC: 31.4 g/dL (ref 30.0–36.0)
MCV: 87.4 fL (ref 80.0–100.0)
NRBC: 0 % (ref 0.0–0.2)
PLATELETS: 224 10*3/uL (ref 150–400)
RBC: 4.52 MIL/uL (ref 3.87–5.11)
RDW: 15.3 % (ref 11.5–15.5)
WBC: 5.1 10*3/uL (ref 4.0–10.5)

## 2018-02-20 LAB — CBG MONITORING, ED: Glucose-Capillary: 154 mg/dL — ABNORMAL HIGH (ref 70–99)

## 2018-02-20 LAB — TROPONIN I

## 2018-02-20 MED ORDER — HEPARIN SODIUM (PORCINE) 5000 UNIT/ML IJ SOLN
5000.0000 [IU] | Freq: Three times a day (TID) | INTRAMUSCULAR | Status: DC
  Filled 2018-02-20: qty 1

## 2018-02-20 MED ORDER — ONDANSETRON HCL 4 MG/2ML IJ SOLN: 4.0000 mg | Freq: Four times a day (QID) | INTRAMUSCULAR | Status: DC | PRN

## 2018-02-20 MED ORDER — SODIUM CHLORIDE 0.9 % IV SOLN
INTRAVENOUS | Status: AC
  Administered 2018-02-21: 01:00:00 via INTRAVENOUS

## 2018-02-20 MED ORDER — OMEGA-3-ACID ETHYL ESTERS 1 G PO CAPS
1.0000 g | ORAL_CAPSULE | Freq: Two times a day (BID) | ORAL | Status: DC
  Administered 2018-02-21: 1 g via ORAL
  Filled 2018-02-20: qty 1

## 2018-02-20 MED ORDER — ONDANSETRON HCL 4 MG PO TABS: 4.0000 mg | ORAL_TABLET | Freq: Four times a day (QID) | ORAL | Status: DC | PRN

## 2018-02-20 MED ORDER — ASPIRIN EC 81 MG PO TBEC
81.0000 mg | DELAYED_RELEASE_TABLET | Freq: Every day | ORAL | Status: DC
  Administered 2018-02-21: 81 mg via ORAL
  Filled 2018-02-20: qty 1

## 2018-02-20 MED ORDER — ACETAMINOPHEN 325 MG PO TABS: 650.0000 mg | ORAL_TABLET | Freq: Four times a day (QID) | ORAL | Status: DC | PRN

## 2018-02-20 MED ORDER — SODIUM CHLORIDE 0.9% FLUSH
3.0000 mL | Freq: Two times a day (BID) | INTRAVENOUS | Status: DC
  Administered 2018-02-21 (×2): 3 mL via INTRAVENOUS

## 2018-02-20 MED ORDER — INSULIN ASPART 100 UNIT/ML ~~LOC~~ SOLN: 0.0000 [IU] | Freq: Every day | SUBCUTANEOUS | Status: DC

## 2018-02-20 MED ORDER — ACETAMINOPHEN 650 MG RE SUPP: 650.0000 mg | Freq: Four times a day (QID) | RECTAL | Status: DC | PRN

## 2018-02-20 MED ORDER — INSULIN ASPART 100 UNIT/ML ~~LOC~~ SOLN: 0.0000 [IU] | Freq: Three times a day (TID) | SUBCUTANEOUS | Status: DC

## 2018-02-20 MED ORDER — SODIUM CHLORIDE 0.9 % IV BOLUS
1000.0000 mL | Freq: Once | INTRAVENOUS | Status: AC
  Administered 2018-02-20: 1000 mL via INTRAVENOUS

## 2018-02-20 MED ORDER — NEPAFENAC 0.1 % OP SUSP
1.0000 [drp] | Freq: Every day | OPHTHALMIC | Status: DC
  Administered 2018-02-21: 1 [drp] via OPHTHALMIC
  Filled 2018-02-20: qty 3

## 2018-02-20 NOTE — H&P (Signed)
History and Physical    RAY GLACKEN NWG:[id number] DOB: 03-Jun-1940 DOA: 02/20/2018  PCP: Renaye Rakers, MD   Patient coming from: Home   Chief Complaint: Syncope   HPI: [name] is a 77 y.o. female with medical history significant for mild renal insufficiency, now presenting to the emergency department after syncopal episode.  Patient reports that she was in her usual state of health, having an uneventful day, and was at the gym, seated had a weights machine doing upper body resistance training when she developed an acute cool and clammy sensation followed by lightheadedness.  She then recalls a small crowd gathered around her.  She was helped back up and return to her usual state within a few minutes.  She denies any associated chest pain or palpitations and denies any headache, change in vision or hearing, or focal numbness or weakness.  She reports experiencing similar episodes previously, most recently in 2012.  ED Course: Upon arrival to the ED, patient is found to be afebrile, saturating well on room air, and with vitals otherwise stable.  EKG features a sinus rhythm with borderline ST and T abnormalities that are not significantly changed from priors.  Chest x-ray is negative for acute cardiopulmonary disease.  Chemistry panel is notable for serum glucose of 200 and creatinine 1.26, up from 1.02 in 2016.  CBC is unremarkable and troponin undetectable.  Patient was given a liter of normal saline in the ED and hospitalist were asked to admit for further evaluation and management.  Review of Systems:  All other systems reviewed and apart from HPI, are negative.  Past Medical History:  Diagnosis Date  . Chronic kidney disease     Past Surgical History:  Procedure Laterality Date  . CAPSULOTOMY Right 07/25/2012   Procedure: MINOR PROCEDURE YAG LASER CAPSULOTOMY;  Surgeon: Vita Erm., MD;  Location: [facility] OR;  Service: Ophthalmology;  Laterality: Right;  . CATARACT  EXTRACTION W/PHACO Left 04/27/2015   Procedure: CATARACT EXTRACTION PHACO AND INTRAOCULAR LENS PLACEMENT (IOC) LEFT EYE;  Surgeon: Chalmers Guest, MD;  Location: [facility] OR;  Service: Ophthalmology;  Laterality: Left;  . COLONOSCOPY    . EYE SURGERY Right    Caract     reports that she has quit smoking. She quit after 18.00 years of use. She does not have any smokeless tobacco history on file. She reports that she does not drink alcohol or use drugs.  No Known Allergies  Family History  Problem Relation Age of Onset  . Obesity Daughter      Prior to Admission medications   Medication Sig Start Date End Date Taking? Authorizing Provider  aspirin EC 81 MG tablet Take 81 mg by mouth daily.    [provider]  Calcitriol (VECTICAL) 3 MCG/GM cream Apply topically 2 (two) times daily.    [provider]  Calcium Carbonate-Vitamin D (CALCIUM 600 + D PO) Take 1 tablet by mouth daily.    [provider]  CILOXAN 0.3 % ophthalmic ointment Place 1 drop into the left eye 2 (two) times daily.  04/04/15   [provider]  clobetasol ointment (TEMOVATE) 0.05 % Apply 1 application topically daily as needed. 02/08/15   [provider]  ILEVRO 0.3 % ophthalmic suspension Place 1 drop into the left eye daily. 04/04/15   [provider]  Multiple Vitamins-Minerals (CENTRUM SILVER ADULT 50+ PO) Take 1 tablet by mouth daily.    [provider]  omega-3 acid ethyl esters (LOVAZA)  1 G capsule Take 1 g by mouth 4 (four) times daily.     [provider]    Physical Exam: Vitals:   02/20/18 2300 02/20/18 2315 02/21/18 0100 02/21/18 0311  BP: 138/66 (!) 142/77 (!) 148/73 115/64  Pulse: 68 69 68 72  Resp: 12 18 18 18   Temp:   98 F (36.7 C) 98.3 F (36.8 C)  TempSrc:   Oral Oral  SpO2: 97% 99% 100% 100%  Weight:   77.1 kg   Height:   5\' 3"  (1.6 m)     Constitutional: NAD, calm  Eyes: PERTLA, lids and conjunctivae normal ENMT: Mucous  membranes are moist. Posterior pharynx clear of any exudate or lesions.   Neck: normal, supple, no masses, no thyromegaly Respiratory: clear to auscultation bilaterally, no wheezing, no crackles. Normal respiratory effort.   Cardiovascular: S1 & S2 heard, regular rate and rhythm. No extremity edema.   Abdomen: No distension, no tenderness, soft. Bowel sounds normal.  Musculoskeletal: no clubbing / cyanosis. No joint deformity upper and lower extremities.    Skin: no significant rashes, lesions, ulcers. Warm, dry, well-perfused. Neurologic: CN 2-12 grossly intact. Sensation intact. Strength 5/5 in all 4 limbs.  Psychiatric:  Alert and oriented x 3. Pleasant and cooperative.    Labs on Admission: I have personally reviewed following labs and imaging studies  CBC: Recent Labs  Lab 02/20/18 1817  WBC 5.1  HGB 12.4  HCT 39.5  MCV 87.4  PLT 224   Basic Metabolic Panel: Recent Labs  Lab 02/20/18 1817  NA 138  K 3.6  CL 106  CO2 24  GLUCOSE 200*  BUN 16  CREATININE 1.26*  CALCIUM 9.2   GFR: Estimated Creatinine Clearance: 35.6 mL/min (A) (by C-G formula based on SCr of 1.26 mg/dL (H)). Liver Function Tests: No results for input(s): AST, ALT, ALKPHOS, BILITOT, PROT, ALBUMIN in the last 168 hours. No results for input(s): LIPASE, AMYLASE in the last 168 hours. No results for input(s): AMMONIA in the last 168 hours. Coagulation Profile: No results for input(s): INR, PROTIME in the last 168 hours. Cardiac Enzymes: Recent Labs  Lab 02/20/18 2219  TROPONINI <0.03   BNP (last 3 results) No results for input(s): PROBNP in the last 8760 hours. HbA1C: No results for input(s): HGBA1C in the last 72 hours. CBG: Recent Labs  Lab 02/20/18 1831 02/21/18 0050  GLUCAP 154* 112*   Lipid Profile: No results for input(s): CHOL, HDL, LDLCALC, TRIG, CHOLHDL, LDLDIRECT in the last 72 hours. Thyroid Function Tests: No results for input(s): TSH, T4TOTAL, FREET4, T3FREE, THYROIDAB in  the last 72 hours. Anemia Panel: No results for input(s): VITAMINB12, FOLATE, FERRITIN, TIBC, IRON, RETICCTPCT in the last 72 hours. Urine analysis:    Component Value Date/Time   COLORURINE YELLOW 06/22/2014 0630   APPEARANCEUR CLOUDY (A) 06/22/2014 0630   LABSPEC 1.012 06/22/2014 0630   PHURINE 6.0 06/22/2014 0630   GLUCOSEU NEGATIVE 06/22/2014 0630   HGBUR NEGATIVE 06/22/2014 0630   BILIRUBINUR NEGATIVE 06/22/2014 0630   KETONESUR NEGATIVE 06/22/2014 0630   PROTEINUR NEGATIVE 06/22/2014 0630   UROBILINOGEN 0.2 06/22/2014 0630   NITRITE NEGATIVE 06/22/2014 0630   LEUKOCYTESUR MODERATE (A) 06/22/2014 0630   Sepsis Labs: @LABRCNTIP (procalcitonin:4,lacticidven:4) )No results found for this or any previous visit (from the past 240 hour(s)).   Radiological Exams on Admission: Dg Chest 2 View  Result Date: 02/20/2018 CLINICAL DATA:  Syncope episode today. Pt said she was at the gym when she got dizzy and passed  out. Former [phone]). EXAM: CHEST - 2 VIEW COMPARISON:  none FINDINGS: Lungs are clear. Heart size and mediastinal contours are within normal limits. No effusion. Visualized bones unremarkable. IMPRESSION: No acute cardiopulmonary disease. Electronically Signed   By: Corlis Leak M.D.   On: 02/20/2018 20:09    EKG: Independently reviewed. Sinus rhythm, borderline ST and T abnormalities not significantly changed from prior.   Assessment/Plan   1. Syncope  - Presents following a syncopal episode that occurred while performing resistance exercises at the gym, preceded by a cool & clammy sensation, similar to prior episodes that she had several years ago   - EKG with sinus rhythm, no blocks or arrhythmia, no known hx of heart disease  - Likely neurally-mediated, assess for cardiogenic etiology with continued cardiac monitoring, orthostatic vitals, and echocardiogram   2. Mild renal insufficiency  - SCr is 1.26 on admission, up from 1.06 in 2016  - Appears euvolemic, likely  mild CKD  - She was given a liter of NS in ED, will repeat chem panel in am   3. Hyperglycemia - Serum glucose 200 in ED  - No hx of DM, possibly stress-induced  - Check CBG's and treat as-needed     DVT prophylaxis: sq heparin  Code Status: Full  Family Communication: Daughter updated at bedside Consults called: None Admission status: Observation     Briscoe Deutscher, MD [facility] Pager [phone]  If 7PM-7AM, please contact night-coverage [email or web address] Password [email or web address]  02/21/2018, 3:16 AM

## 2018-02-20 NOTE — ED Provider Notes (Signed)
[facility] EMERGENCY DEPARTMENT Provider Note   CSN: [id number] Arrival date & time: 02/20/18  1805     History   Chief Complaint Chief Complaint  Patient presents with  . Loss of Consciousness    HPI [name] is a 77 y.o. female.  77 year old female who presents with syncope.  Earlier today just prior to arrival, the patient was at [employer] and began feeling hot.  She then started feeling lightheaded and bystanders reported that she had a brief syncopal episode.  She did not hit her head or injure herself when she collapsed.  EMS was called and she has been alert and transport.  She denies any chest pain or shortness of breath before or after the episode.  She has been eating and drinking normally today, no recent illness including no fevers, vomiting, diarrhea, or cold symptoms.  She denies any history of heart problems.  The history is provided by the patient.  Loss of Consciousness      History reviewed. No pertinent past medical history.  There are no active problems to display for this patient.   Past Surgical History:  Procedure Laterality Date  . CAPSULOTOMY Right 07/25/2012   Procedure: MINOR PROCEDURE YAG LASER CAPSULOTOMY;  Surgeon: Vita Erm., MD;  Location: [facility] OR;  Service: Ophthalmology;  Laterality: Right;  . CATARACT EXTRACTION W/PHACO Left 04/27/2015   Procedure: CATARACT EXTRACTION PHACO AND INTRAOCULAR LENS PLACEMENT (IOC) LEFT EYE;  Surgeon: Chalmers Guest, MD;  Location: [facility] OR;  Service: Ophthalmology;  Laterality: Left;  . COLONOSCOPY    . EYE SURGERY Right    Caract     OB History   None      Home Medications    Prior to Admission medications   Medication Sig Start Date End Date Taking? Authorizing Provider  aspirin EC 81 MG tablet Take 81 mg by mouth daily.    [provider]  Calcitriol (VECTICAL) 3 MCG/GM cream Apply topically 2 (two) times daily.    [provider]  Calcium Carbonate-Vitamin D (CALCIUM 600 + D PO) Take 1 tablet by mouth daily.    [provider]  CILOXAN 0.3 % ophthalmic ointment Place 1 drop into the left eye 2 (two) times daily.  04/04/15   [provider]  clobetasol ointment (TEMOVATE) 0.05 % Apply 1 application topically daily as needed. 02/08/15   [provider]  ILEVRO 0.3 % ophthalmic suspension Place 1 drop into the left eye daily. 04/04/15   [provider]  Multiple Vitamins-Minerals (CENTRUM SILVER ADULT 50+ PO) Take 1 tablet by mouth daily.    [provider]  omega-3 acid ethyl esters (LOVAZA) 1 G capsule Take 1 g by mouth 4 (four) times daily.     [provider]    Family History History reviewed. No pertinent family history.  Social History Social History   Tobacco Use  . Smoking status: Former Smoker    Years: 18.00  . Tobacco comment: quit in 1995  Substance Use Topics  . Alcohol use: No  . Drug use: No     Allergies   Patient has no known allergies.   Review of Systems Review of Systems  Cardiovascular: Positive for syncope.   All other systems reviewed and are negative except that which was mentioned in HPI   Physical Exam Updated Vital Signs BP 126/66   Pulse 69   Temp (!) 97.5 F (36.4 C) (Oral)  Resp 18   Ht 5\' 3"  (1.6 m)   Wt 77.1 kg   SpO2 98%   BMI 30.11 kg/m   Physical Exam  Constitutional: She is oriented to person, place, and time. She appears well-developed and well-nourished. No distress.  HENT:  Head: Normocephalic and atraumatic.  Moist mucous membranes  Eyes: Conjunctivae are normal.  Neck: Neck supple.  Cardiovascular: Normal rate, regular rhythm and normal heart sounds.  No murmur heard. Pulmonary/Chest: Effort normal and breath sounds normal.  Abdominal: Soft. Bowel sounds are normal. She exhibits no distension. There is no tenderness.  Musculoskeletal: She exhibits no edema.  Neurological: She  is alert and oriented to person, place, and time.  Fluent speech  Skin: Skin is warm and dry.  Psychiatric: She has a normal mood and affect. Judgment normal.  Nursing note and vitals reviewed.    ED Treatments / Results  Labs (all labs ordered are listed, but only abnormal results are displayed) Labs Reviewed  BASIC METABOLIC PANEL - Abnormal; Notable for the following components:      Result Value   Glucose, Bld 200 (*)    Creatinine, Ser 1.26 (*)    GFR calc non Af Amer 39 (*)    GFR calc Af Amer 46 (*)    All other components within normal limits  CBG MONITORING, ED - Abnormal; Notable for the following components:   Glucose-Capillary 154 (*)    All other components within normal limits  CBC  TROPONIN I  URINALYSIS, ROUTINE W REFLEX MICROSCOPIC    EKG EKG Interpretation  Date/Time:  Thursday February 20 2018 18:06:25 EDT Ventricular Rate:  80 PR Interval:    QRS Duration: 89 QT Interval:  336 QTC Calculation: 388 R Axis:   30 Text Interpretation:  Sinus rhythm Borderline T abnormalities, diffuse leads No significant change since last tracing Confirmed by Frederick Peers [phone]) on 02/20/2018 6:38:54 PM   Radiology Dg Chest 2 View  Result Date: 02/20/2018 CLINICAL DATA:  Syncope episode today. Pt said she was at the gym when she got dizzy and passed out. Former [phone]). EXAM: CHEST - 2 VIEW COMPARISON:  none FINDINGS: Lungs are clear. Heart size and mediastinal contours are within normal limits. No effusion. Visualized bones unremarkable. IMPRESSION: No acute cardiopulmonary disease. Electronically Signed   By: Corlis Leak M.D.   On: 02/20/2018 20:09    Procedures Procedures (including critical care time)  Medications Ordered in ED Medications  sodium chloride 0.9 % bolus 1,000 mL (1,000 mLs Intravenous New Bag/Given 02/20/18 2057)     Initial Impression / Assessment and Plan / ED Course  I have reviewed the triage vital signs and the nursing  notes.  Pertinent labs & imaging results that were available during my care of the patient were reviewed by me and considered in my medical decision making (see chart for details).     Well Appearing and comfortable on exam, no complaints.  EKG similar to previous.  Lab work shows glucose 200, creatinine 1.26, negative troponin, normal CBC.  Chest x-ray negative acute.  I am concerned about her syncopal episode given her age as well as the fact that it was during exertion.  Recommended admission for syncope work-up.  Discussed with [facility], Dr. Antionette Char, and pt admitted for further w/u.   Final Clinical Impressions(s) / ED Diagnoses   Final diagnoses:  None    ED Discharge Orders    None       Little, Ambrose Finland, MD 02/21/18 [phone]

## 2018-02-20 NOTE — ED Notes (Signed)
CBG 154. 

## 2018-02-20 NOTE — ED Triage Notes (Signed)
Per EMS pt was at [employer] and has a syncopal event.  No medical history.  Pt states that she just takes fish oil.  She did fall down but reports no pain or trauma.  When [facility] got on scene she was noted to be lathargic.  She is currently AOx4 denies pain or dizziness.

## 2018-02-21 ENCOUNTER — Encounter (HOSPITAL_COMMUNITY): Payer: Self-pay | Admitting: Family Medicine

## 2018-02-21 ENCOUNTER — Observation Stay (HOSPITAL_COMMUNITY): Payer: Medicare Other

## 2018-02-21 ENCOUNTER — Observation Stay (HOSPITAL_BASED_OUTPATIENT_CLINIC_OR_DEPARTMENT_OTHER): Payer: Medicare Other

## 2018-02-21 DIAGNOSIS — R55 Syncope and collapse: Secondary | ICD-10-CM | POA: Diagnosis not present

## 2018-02-21 DIAGNOSIS — N182 Chronic kidney disease, stage 2 (mild): Secondary | ICD-10-CM | POA: Diagnosis not present

## 2018-02-21 DIAGNOSIS — M25511 Pain in right shoulder: Secondary | ICD-10-CM | POA: Diagnosis not present

## 2018-02-21 DIAGNOSIS — I5030 Unspecified diastolic (congestive) heart failure: Secondary | ICD-10-CM | POA: Diagnosis not present

## 2018-02-21 DIAGNOSIS — N179 Acute kidney failure, unspecified: Secondary | ICD-10-CM | POA: Diagnosis not present

## 2018-02-21 DIAGNOSIS — Z87891 Personal history of nicotine dependence: Secondary | ICD-10-CM | POA: Diagnosis not present

## 2018-02-21 DIAGNOSIS — R739 Hyperglycemia, unspecified: Secondary | ICD-10-CM | POA: Diagnosis not present

## 2018-02-21 DIAGNOSIS — N289 Disorder of kidney and ureter, unspecified: Secondary | ICD-10-CM | POA: Diagnosis not present

## 2018-02-21 DIAGNOSIS — Z79899 Other long term (current) drug therapy: Secondary | ICD-10-CM | POA: Diagnosis not present

## 2018-02-21 DIAGNOSIS — Z7982 Long term (current) use of aspirin: Secondary | ICD-10-CM | POA: Diagnosis not present

## 2018-02-21 LAB — URINALYSIS, ROUTINE W REFLEX MICROSCOPIC
BILIRUBIN URINE: NEGATIVE
Glucose, UA: NEGATIVE mg/dL
Hgb urine dipstick: NEGATIVE
KETONES UR: NEGATIVE mg/dL
LEUKOCYTES UA: NEGATIVE
NITRITE: NEGATIVE
PROTEIN: NEGATIVE mg/dL
Specific Gravity, Urine: 1.008 (ref 1.005–1.030)
pH: 6 (ref 5.0–8.0)

## 2018-02-21 LAB — GLUCOSE, CAPILLARY
GLUCOSE-CAPILLARY: 112 mg/dL — AB (ref 70–99)
Glucose-Capillary: 118 mg/dL — ABNORMAL HIGH (ref 70–99)

## 2018-02-21 LAB — BASIC METABOLIC PANEL
Anion gap: 10 (ref 5–15)
BUN: 11 mg/dL (ref 8–23)
CHLORIDE: 109 mmol/L (ref 98–111)
CO2: 23 mmol/L (ref 22–32)
Calcium: 9.1 mg/dL (ref 8.9–10.3)
Creatinine, Ser: 0.97 mg/dL (ref 0.44–1.00)
GFR calc Af Amer: >60 mL/min (ref >60)
GFR calc non Af Amer: 54 mL/min — ABNORMAL LOW (ref >60)
Glucose, Bld: 115 mg/dL — ABNORMAL HIGH (ref 70–99)
POTASSIUM: 4.4 mmol/L (ref 3.5–5.1)
Sodium: 142 mmol/L (ref 135–145)

## 2018-02-21 LAB — ECHOCARDIOGRAM COMPLETE
HEIGHTINCHES: 63 in
Weight: 2719.59 oz

## 2018-02-21 MED ORDER — CARBAMIDE PEROXIDE 6.5 % OT SOLN: 5.0000 [drp] | Freq: Two times a day (BID) | OTIC | 0 refills | Status: AC

## 2018-02-21 MED ORDER — ASPIRIN EC 81 MG PO TBEC: 81.0000 mg | DELAYED_RELEASE_TABLET | Freq: Every day | ORAL | 0 refills | Status: DC

## 2018-02-21 MED ORDER — METHOCARBAMOL 500 MG PO TABS: 500.0000 mg | ORAL_TABLET | Freq: Three times a day (TID) | ORAL | Status: DC | PRN

## 2018-02-21 MED ORDER — ILEVRO 0.3 % OP SUSP: 1.0000 [drp] | Freq: Every day | OPHTHALMIC | Status: DC

## 2018-02-21 MED ORDER — METHOCARBAMOL 500 MG PO TABS: 500.0000 mg | ORAL_TABLET | Freq: Three times a day (TID) | ORAL | 0 refills | Status: DC | PRN

## 2018-02-21 MED ORDER — CARBAMIDE PEROXIDE 6.5 % OT SOLN
5.0000 [drp] | Freq: Two times a day (BID) | OTIC | Status: DC
  Filled 2018-02-21: qty 15

## 2018-02-21 NOTE — Progress Notes (Signed)
   02/21/18 1025  Clinical Encounter Type  Visited With Patient and family together  Visit Type Initial  Referral From Physician  Consult/Referral To Chaplain  Spiritual Encounters  Spiritual Needs Prayer  Chaplain responded to Pt. request for prayer from spiritual consult list.  The chaplain was welcomed into room by Pt.(sitting up) and Pt. Daughter.  The Pt. shared her hope for answers from today's scheduled tests and the ability to return home to family in her prayer request. The chaplain invited the Pt. to page a chaplain through the RN if needed for spiritual care in the future.

## 2018-02-21 NOTE — Plan of Care (Addendum)
Patient stable, discussed POC with patient.

## 2018-02-21 NOTE — Discharge Summary (Signed)
Physician Discharge Summary  [name] ZOX:[id number] DOB: March 04, 1941 DOA: 02/20/2018  PCP: Renaye Rakers, MD  Admit date: 02/20/2018 Discharge date: 02/21/2018  Admitted From: Home.  Disposition:  Home.  Recommendations for Outpatient Follow-up:  1. Follow up with PCP in 1-2 weeks 2. Please obtain BMP/CBC in one week.  Discharge Condition: stable.  CODE STATUS: full code.  Diet recommendation: Heart Healthy   Brief/Interim Summary: [name] is a 77 y.o. female with medical history significant for mild renal insufficiency, now presenting to the emergency department after syncopal episode.  Upon arrival to the ED, patient is found to be afebrile, saturating well on room air, and with vitals otherwise stable.  EKG features a sinus rhythm with borderline ST and T abnormalities that are not significantly changed from priors.  Chest x-ray is negative for acute cardiopulmonary disease.  Chemistry panel is notable for serum glucose of 200 and creatinine 1.26, up from 1.02 in 2016.  CBC is unremarkable and troponin undetectable.  Patient was given a liter of normal saline in the ED and hospitalist were asked to admit for further evaluation and management.  Discharge Diagnoses:  Principal Problem:   Syncope Active Problems:   Hyperglycemia   Mild renal insufficiency  Syncope  - Presents following a syncopal episode that occurred while performing resistance exercises at the gym, preceded by a cool & clammy sensation, similar to prior episodes that she had several years ago   - EKG with sinus rhythm, no blocks or arrhythmia, no known hx of heart disease  Differential include vaso vagal vs  Orthostatic dehydration  Hydrated and she is currently asymptomatic.  orthostatic vital signs repeated negative.  PT eval does not show any follow up.  Echocardiogram does not show significant pathology.    2. AKI:  - SCr is 1.26 on admission, up from 1.06 in 2016  - Appears  euvolemic, likely mild CKD  - resolved with hydration.   Discharge Instructions  Discharge Instructions    Diet - low sodium heart healthy   Complete by:  As directed    Discharge instructions   Complete by:  As directed    Please follow up with PCP in one week. You were probably dehydrated and could explain the passing out.     Allergies as of 02/21/2018   No Known Allergies     Medication List    TAKE these medications   aspirin EC 81 MG tablet Take 1 tablet (81 mg total) by mouth daily.   CALCIUM 600 + D PO Take 1 tablet by mouth daily.   carbamide peroxide 6.5 % OTIC solution Commonly known as:  DEBROX Place 5 drops into the left ear 2 (two) times daily.   CENTRUM SILVER ADULT 50+ PO Take 1 tablet by mouth daily.   clobetasol ointment 0.05 % Commonly known as:  TEMOVATE Apply 1 application topically daily as needed (flare up).   ILEVRO 0.3 % ophthalmic suspension Generic drug:  nepafenac Place 1 drop into the left eye daily.   methocarbamol 500 MG tablet Commonly known as:  ROBAXIN Take 1 tablet (500 mg total) by mouth every 8 (eight) hours as needed for muscle spasms.   omega-3 acid ethyl esters 1 g capsule Commonly known as:  LOVAZA Take 1 g by mouth 4 (four) times daily.   SYSTANE OP Place 1-2 drops into both eyes 2 (two) times daily.   TURMERIC PO Take 1 tablet by mouth 2 (two) times daily. With ginger  VECTICAL 3 MCG/GM cream Generic drug:  Calcitriol Apply 1 application topically 2 (two) times daily.      Follow-up Information    Renaye Rakers, MD. Schedule an appointment as soon as possible for a visit in 1 week(s).   Specialty:  Family Medicine Contact information: [address] [place] [address] [phone]          No Known Allergies  Consultations:  None.    Procedures/Studies: Dg Chest 2 View  Result Date: 02/20/2018 CLINICAL DATA:  Syncope episode today. Pt said she was at the gym when she got dizzy and  passed out. Former [phone]). EXAM: CHEST - 2 VIEW COMPARISON:  none FINDINGS: Lungs are clear. Heart size and mediastinal contours are within normal limits. No effusion. Visualized bones unremarkable. IMPRESSION: No acute cardiopulmonary disease. Electronically Signed   By: Corlis Leak M.D.   On: 02/20/2018 20:09   Dg Shoulder Right  Result Date: 02/21/2018 CLINICAL DATA:  Severe anterior RIGHT shoulder pain for 1 year, recent syncopal episode at the gym, worsened pain with sleeping EXAM: RIGHT SHOULDER - 2+ VIEW COMPARISON:  None FINDINGS: Osseous demineralization. AC joint alignment normal. No acute fracture, dislocation, or bone destruction. Visualized RIGHT ribs intact. IMPRESSION: No acute osseous abnormalities. Electronically Signed   By: Ulyses Southward M.D.   On: 02/21/2018 16:35       Subjective: No chest pain or sob.   Discharge Exam: Vitals:   02/21/18 1210 02/21/18 1702  BP: (!) 141/66 134/76  Pulse: 75 67  Resp: 18 18  Temp: (!) 97.5 F (36.4 C)   SpO2: 100% 100%   Vitals:   02/21/18 0311 02/21/18 0805 02/21/18 1210 02/21/18 1702  BP: 115/64 135/79 (!) 141/66 134/76  Pulse: 72 80 75 67  Resp: 18 18 18 18   Temp: 98.3 F (36.8 C)  (!) 97.5 F (36.4 C)   TempSrc: Oral  Oral   SpO2: 100% 98% 100% 100%  Weight:      Height:        General: Pt is alert, awake, not in acute distress Cardiovascular: RRR, S1/S2 +, no rubs, no gallops Respiratory: CTA bilaterally, no wheezing, no rhonchi Abdominal: Soft, NT, ND, bowel sounds + Extremities: no edema, no cyanosis    The results of significant diagnostics from this hospitalization (including imaging, microbiology, ancillary and laboratory) are listed below for reference.     Microbiology: No results found for this or any previous visit (from the past 240 hour(s)).   Labs: BNP (last 3 results) No results for input(s): BNP in the last 8760 hours. Basic Metabolic Panel: Recent Labs  Lab 02/20/18 1817  02/21/18 0500  NA 138 142  K 3.6 4.4  CL 106 109  CO2 24 23  GLUCOSE 200* 115*  BUN 16 11  CREATININE 1.26* 0.97  CALCIUM 9.2 9.1   Liver Function Tests: No results for input(s): AST, ALT, ALKPHOS, BILITOT, PROT, ALBUMIN in the last 168 hours. No results for input(s): LIPASE, AMYLASE in the last 168 hours. No results for input(s): AMMONIA in the last 168 hours. CBC: Recent Labs  Lab 02/20/18 1817  WBC 5.1  HGB 12.4  HCT 39.5  MCV 87.4  PLT 224   Cardiac Enzymes: Recent Labs  Lab 02/20/18 2219  TROPONINI <0.03   BNP: Invalid input(s): POCBNP CBG: Recent Labs  Lab 02/20/18 1831 02/21/18 0050 02/21/18 0601  GLUCAP 154* 112* 118*   D-Dimer No results for input(s): DDIMER in the last 72  hours. Hgb A1c No results for input(s): HGBA1C in the last 72 hours. Lipid Profile No results for input(s): CHOL, HDL, LDLCALC, TRIG, CHOLHDL, LDLDIRECT in the last 72 hours. Thyroid function studies No results for input(s): TSH, T4TOTAL, T3FREE, THYROIDAB in the last 72 hours.  Invalid input(s): FREET3 Anemia work up No results for input(s): VITAMINB12, FOLATE, FERRITIN, TIBC, IRON, RETICCTPCT in the last 72 hours. Urinalysis    Component Value Date/Time   COLORURINE STRAW (A) 02/20/2018 1811   APPEARANCEUR CLEAR 02/20/2018 1811   LABSPEC 1.008 02/20/2018 1811   PHURINE 6.0 02/20/2018 1811   GLUCOSEU NEGATIVE 02/20/2018 1811   HGBUR NEGATIVE 02/20/2018 1811   BILIRUBINUR NEGATIVE 02/20/2018 1811   KETONESUR NEGATIVE 02/20/2018 1811   PROTEINUR NEGATIVE 02/20/2018 1811   UROBILINOGEN 0.2 06/22/2014 0630   NITRITE NEGATIVE 02/20/2018 1811   LEUKOCYTESUR NEGATIVE 02/20/2018 1811   Sepsis Labs Invalid input(s): PROCALCITONIN,  WBC,  LACTICIDVEN Microbiology No results found for this or any previous visit (from the past 240 hour(s)).   Time coordinating discharge:34 minutes  SIGNED:   Kathlen Mody, MD  [facility] 02/21/2018, 7:40 PM Pager   If  7PM-7AM, please contact night-coverage [email or web address] Password [email or web address]

## 2018-02-21 NOTE — Progress Notes (Signed)
D/C instructions provided to patient, denies questions/concerns at this time. Patient transported to front entrance via WC, tol well. 

## 2018-02-21 NOTE — Evaluation (Signed)
Physical Therapy Evaluation & Discharge Patient Details Name: [name] MRN: [id number] DOB: Apr 27, 1941 Today's Date: 02/21/2018   History of Present Illness  Pt is a 77 y/o female admitted secondary to having a syncopal episode while at the gym. PMH of CKD.  Clinical Impression  Pt presented sitting OOB in recliner chair, awake and willing to participate in therapy session. Prior to admission, pt reported that she was independent with all functional mobility and ADLs. Pt lives in a two level home with a few steps to enter. Her daughter lives with her and is available to assist intermittently. Pt currently independent with transfers and hallway ambulation. 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 Pt reported that she feels she is at her functional baseline at this time. No further acute PT needs identified. PT signing off.    Follow Up Recommendations No PT follow up    Equipment Recommendations  None recommended by PT    Recommendations for Other Services       Precautions / Restrictions Precautions Precautions: Fall Restrictions Weight Bearing Restrictions: No      Mobility  Bed Mobility               General bed mobility comments: pt in recliner chair upon arrival  Transfers Overall transfer level: Independent Equipment used: None                Ambulation/Gait Ambulation/Gait assistance: Modified independent (Device/Increase time) Gait Distance (Feet): 300 Feet Assistive device: None Gait Pattern/deviations: WFL(Within Functional Limits) Gait velocity: WFL   General Gait Details: no LOB or need for UE supports  Stairs Stairs: Yes Stairs assistance: Supervision Stair Management: No rails;Alternating pattern;Forwards Number of Stairs: 2(x2 trials) General stair comments: no LOB or instability, no difficulties  Wheelchair Mobility    Modified  Rankin (Stroke Patients Only)       Balance Overall balance assessment: Needs assistance Sitting-balance support: Feet supported Sitting balance-Leahy Scale: Normal     Standing balance support: No upper extremity supported Standing balance-Leahy Scale: Good                   Standardized Balance Assessment Standardized Balance Assessment : Dynamic Gait Index   Dynamic Gait Index Level Surface: Normal Change in Gait Speed: Normal Gait with Horizontal Head Turns: Normal Gait with Vertical Head Turns: Normal Gait and Pivot Turn: Normal Step Over Obstacle: Normal Step Around Obstacles: Normal Steps: Normal Total Score: 24       Pertinent Vitals/Pain Pain Assessment: No/denies pain    Home Living Family/patient expects to be discharged to:: Private residence Living Arrangements: Children Available Help at Discharge: Family;Available PRN/intermittently Type of Home: House Home Access: Stairs to enter Entrance Stairs-Rails: Right Entrance Stairs-Number of Steps: 3 Home Layout: Two level Home Equipment: Shower seat      Prior Function Level of Independence: Independent         Comments: exercises 3x/week     Hand Dominance        Extremity/Trunk Assessment   Upper Extremity Assessment Upper Extremity Assessment: Overall WFL for tasks assessed    Lower Extremity Assessment Lower Extremity Assessment: Overall WFL for tasks assessed       Communication   Communication: No difficulties  Cognition Arousal/Alertness: Awake/alert Behavior During Therapy: WFL for tasks assessed/performed Overall Cognitive Status: Within Functional Limits for tasks assessed  General Comments      Exercises     Assessment/Plan    PT Assessment Patent does not need any further PT services  PT Problem List         PT Treatment Interventions      PT Goals (Current goals can be found in the Care Plan  section)  Acute Rehab PT Goals Patient Stated Goal: go home, return to working out    Frequency     Barriers to discharge        Co-evaluation               AM-PAC PT "6 Clicks" Daily Activity  Outcome Measure Difficulty turning over in bed (including adjusting bedclothes, sheets and blankets)?: None Difficulty moving from lying on back to sitting on the side of the bed? : None Difficulty sitting down on and standing up from a chair with arms (e.g., wheelchair, bedside commode, etc,.)?: None Help needed moving to and from a bed to chair (including a wheelchair)?: None Help needed walking in hospital room?: None Help needed climbing 3-5 steps with a railing? : None 6 Click Score: 24    End of Session Equipment Utilized During Treatment: Gait belt Activity Tolerance: Patient tolerated treatment well Patient left: in chair;with call bell/phone within reach Nurse Communication: Mobility status PT Visit Diagnosis: Other abnormalities of gait and mobility (R26.89)    Time: 1514-1530 PT Time Calculation (min) (ACUTE ONLY): 16 min   Charges:   PT Evaluation $PT Eval Moderate Complexity: 1 Mod          Deborah Chalk, PT, DPT  Acute Rehabilitation Services Pager [phone] Office [phone]    Alessandra Bevels Issabella Rix 02/21/2018, 4:18 PM

## 2018-02-21 NOTE — Progress Notes (Signed)
  Echocardiogram 2D Echocardiogram has been performed.  Roosvelt Maser F 02/21/2018, 2:56 PM

## 2018-02-22 LAB — HEMOGLOBIN A1C
Hgb A1c MFr Bld: 5 % (ref 4.8–5.6)
Mean Plasma Glucose: 97 mg/dL

## 2018-02-27 DIAGNOSIS — R55 Syncope and collapse: Secondary | ICD-10-CM | POA: Diagnosis not present

## 2018-03-13 ENCOUNTER — Other Ambulatory Visit: Payer: Self-pay | Admitting: Family Medicine

## 2018-03-13 DIAGNOSIS — Z1231 Encounter for screening mammogram for malignant neoplasm of breast: Secondary | ICD-10-CM

## 2018-03-17 DIAGNOSIS — Z6835 Body mass index (BMI) 35.0-35.9, adult: Secondary | ICD-10-CM | POA: Diagnosis not present

## 2018-03-17 DIAGNOSIS — I10 Essential (primary) hypertension: Secondary | ICD-10-CM | POA: Diagnosis not present

## 2018-04-02 DIAGNOSIS — I10 Essential (primary) hypertension: Secondary | ICD-10-CM | POA: Diagnosis not present

## 2018-04-16 DIAGNOSIS — Z23 Encounter for immunization: Secondary | ICD-10-CM | POA: Diagnosis not present

## 2018-04-28 ENCOUNTER — Ambulatory Visit
Admission: RE | Admit: 2018-04-28 | Discharge: 2018-04-28 | Disposition: A | Discharge status: Home / Self Care | Payer: Medicare Other | Source: Ambulatory Visit | Attending: Family Medicine | Admitting: Family Medicine

## 2018-04-28 DIAGNOSIS — Z1231 Encounter for screening mammogram for malignant neoplasm of breast: Secondary | ICD-10-CM | POA: Diagnosis not present

## 2018-08-14 DIAGNOSIS — I10 Essential (primary) hypertension: Secondary | ICD-10-CM | POA: Diagnosis not present

## 2018-08-14 DIAGNOSIS — E782 Mixed hyperlipidemia: Secondary | ICD-10-CM | POA: Diagnosis not present

## 2018-11-28 ENCOUNTER — Other Ambulatory Visit: Payer: Self-pay

## 2018-12-15 DIAGNOSIS — I10 Essential (primary) hypertension: Secondary | ICD-10-CM | POA: Diagnosis not present

## 2018-12-15 DIAGNOSIS — E785 Hyperlipidemia, unspecified: Secondary | ICD-10-CM | POA: Diagnosis not present

## 2018-12-15 DIAGNOSIS — M130 Polyarthritis, unspecified: Secondary | ICD-10-CM | POA: Diagnosis not present

## 2018-12-15 DIAGNOSIS — F064 Anxiety disorder due to known physiological condition: Secondary | ICD-10-CM | POA: Diagnosis not present

## 2018-12-15 DIAGNOSIS — Z7189 Other specified counseling: Secondary | ICD-10-CM | POA: Diagnosis not present

## 2018-12-15 DIAGNOSIS — N189 Chronic kidney disease, unspecified: Secondary | ICD-10-CM | POA: Diagnosis not present

## 2018-12-15 DIAGNOSIS — E7800 Pure hypercholesterolemia, unspecified: Secondary | ICD-10-CM | POA: Diagnosis not present

## 2019-01-08 DIAGNOSIS — I10 Essential (primary) hypertension: Secondary | ICD-10-CM | POA: Diagnosis not present

## 2019-01-08 DIAGNOSIS — E782 Mixed hyperlipidemia: Secondary | ICD-10-CM | POA: Diagnosis not present

## 2019-01-08 DIAGNOSIS — N189 Chronic kidney disease, unspecified: Secondary | ICD-10-CM | POA: Diagnosis not present

## 2019-03-19 DIAGNOSIS — E782 Mixed hyperlipidemia: Secondary | ICD-10-CM | POA: Diagnosis not present

## 2019-03-19 DIAGNOSIS — Z23 Encounter for immunization: Secondary | ICD-10-CM | POA: Diagnosis not present

## 2019-03-19 DIAGNOSIS — I10 Essential (primary) hypertension: Secondary | ICD-10-CM | POA: Diagnosis not present

## 2019-03-19 DIAGNOSIS — I739 Peripheral vascular disease, unspecified: Secondary | ICD-10-CM | POA: Diagnosis not present

## 2019-04-02 ENCOUNTER — Other Ambulatory Visit: Payer: Self-pay | Admitting: Family Medicine

## 2019-04-02 DIAGNOSIS — Z1231 Encounter for screening mammogram for malignant neoplasm of breast: Secondary | ICD-10-CM

## 2019-05-21 ENCOUNTER — Other Ambulatory Visit: Payer: Self-pay

## 2019-05-21 ENCOUNTER — Ambulatory Visit
Admission: RE | Admit: 2019-05-21 | Discharge: 2019-05-21 | Disposition: A | Discharge status: Home / Self Care | Payer: Medicare Other | Source: Ambulatory Visit | Attending: Family Medicine | Admitting: Family Medicine

## 2019-05-21 DIAGNOSIS — Z1231 Encounter for screening mammogram for malignant neoplasm of breast: Secondary | ICD-10-CM | POA: Diagnosis not present

## 2019-06-01 IMAGING — MG DIGITAL SCREENING BILATERAL MAMMOGRAM WITH TOMO AND CAD
6 of 10 series · 6 of 30 positions shown · non-contrast
Comparison: Previous exam(s).

CLINICAL DATA: Screening.

EXAM:
DIGITAL SCREENING BILATERAL MAMMOGRAM WITH TOMO AND CAD

[R CC synth-2D (1 of 2)]
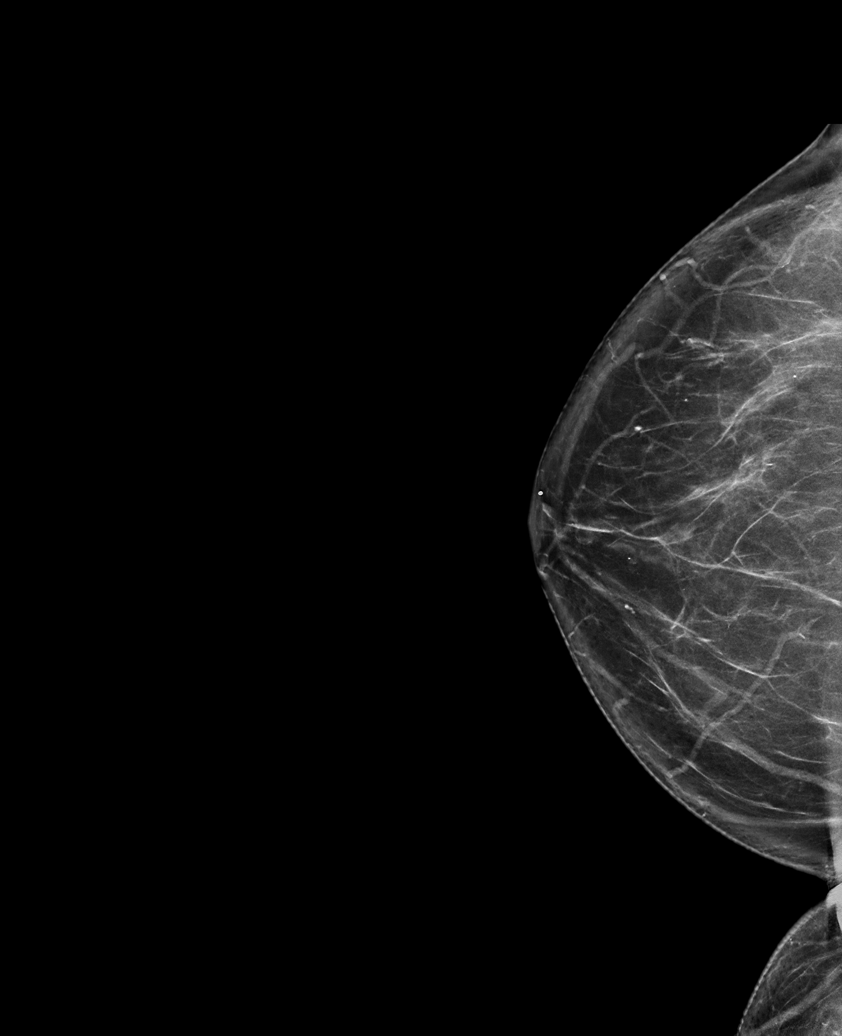

[R CC synth-2D (2 of 2)]
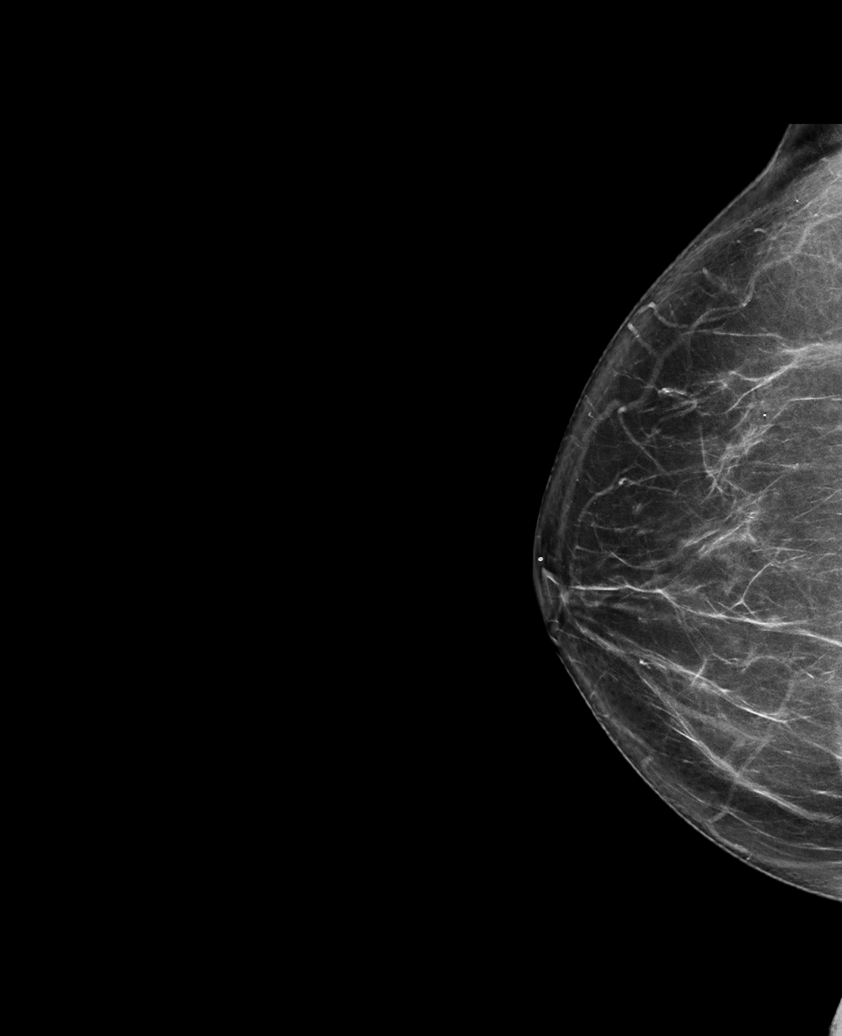

[L CC synth-2D]
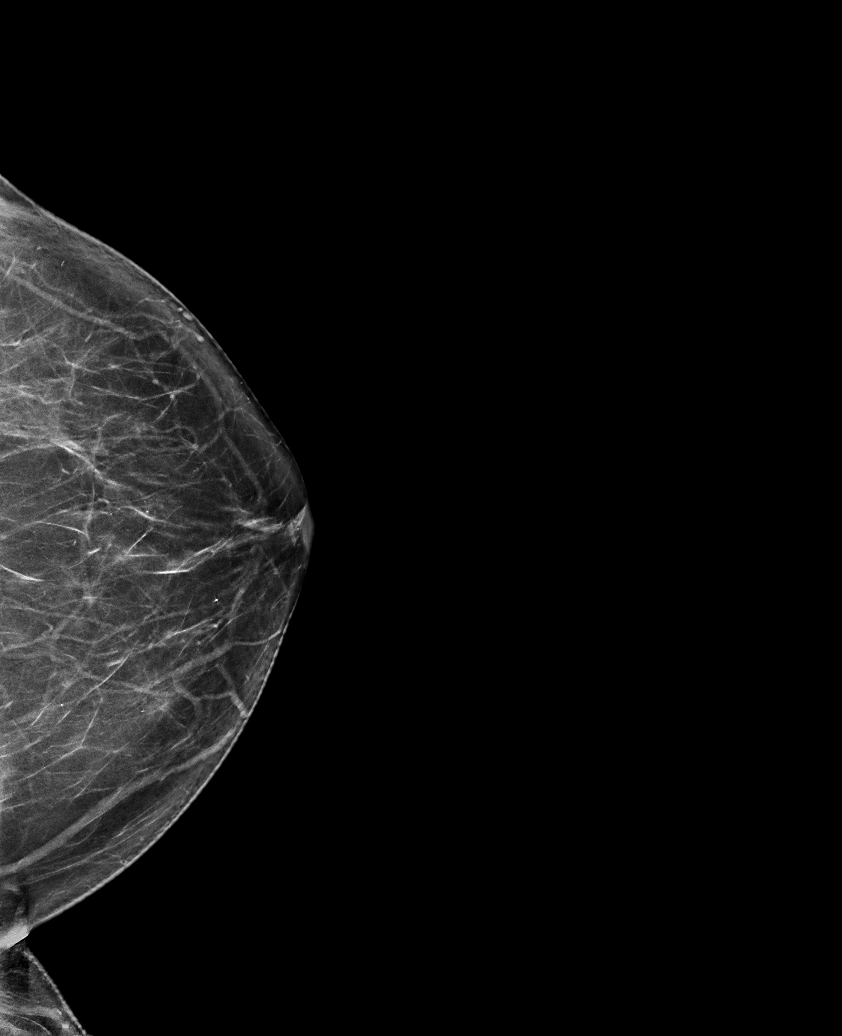

[L MLO synth-2D]
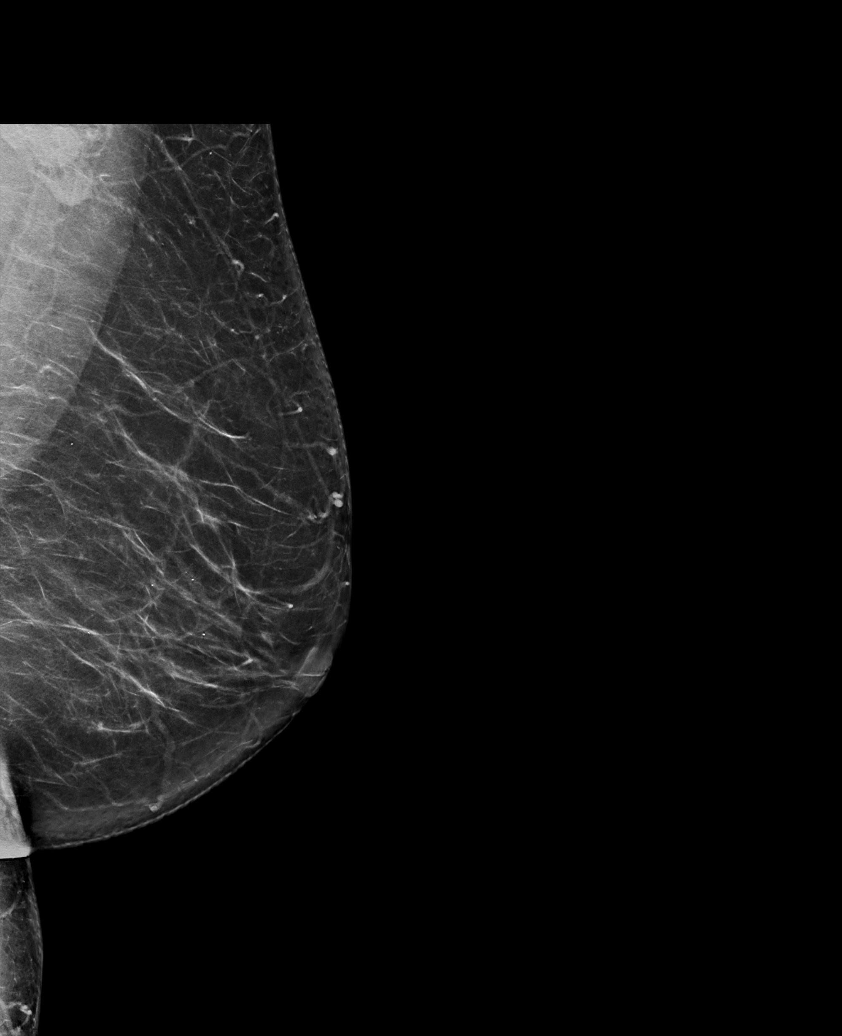

[R MLO synth-2D]
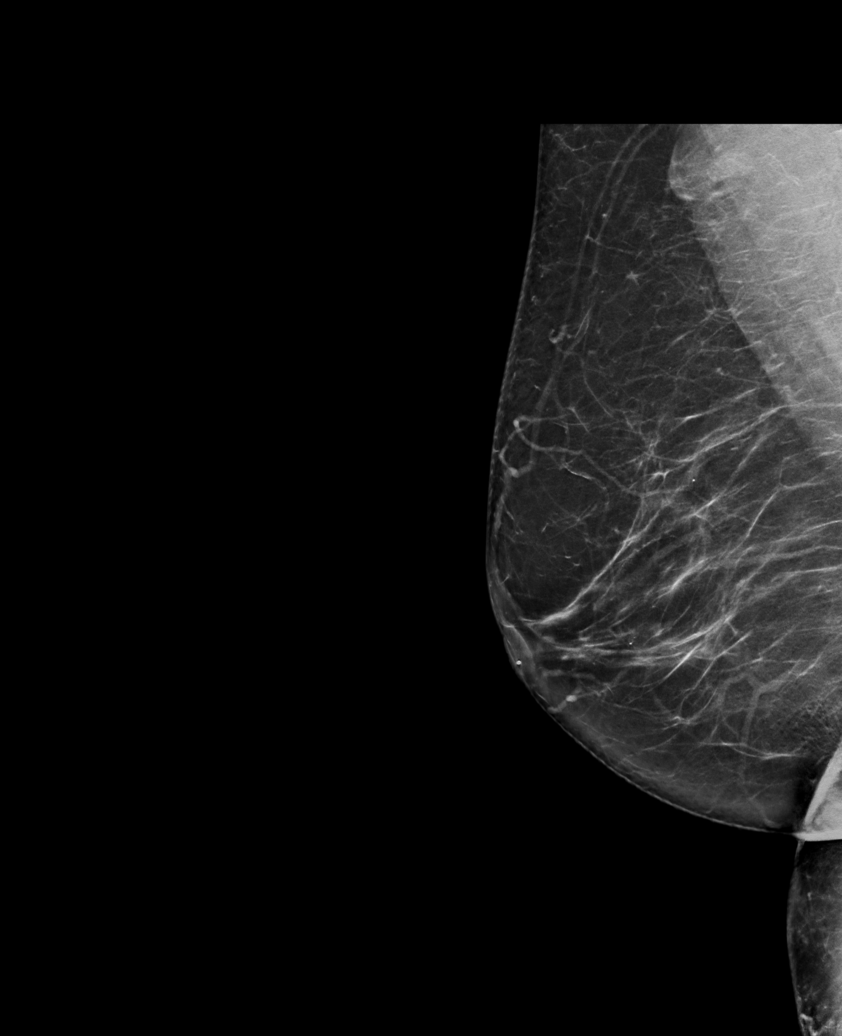

[R MLO tomo · tomo slice 41/80.0]
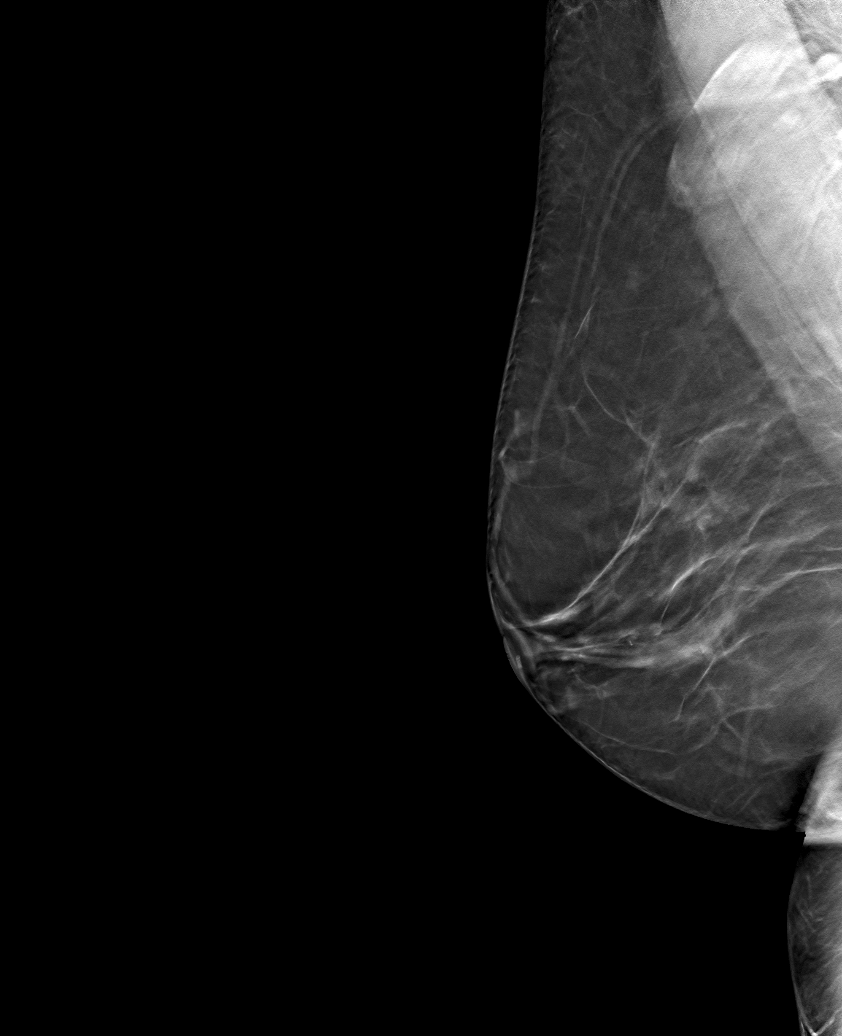

[6 of 30 positions shown; findings below may reference images not displayed]

ACR Breast Density Category b: There are scattered areas of
fibroglandular density.
FINDINGS: There are no findings suspicious for malignancy. Images were
processed with CAD.
IMPRESSION: No mammographic evidence of malignancy. A result letter of this
screening mammogram will be mailed directly to the patient.

RECOMMENDATION:
Screening mammogram in one year. (Code:[id number])

BI-RADS CATEGORY  1: Negative.

## 2019-06-24 DIAGNOSIS — Z23 Encounter for immunization: Secondary | ICD-10-CM | POA: Diagnosis not present

## 2019-07-22 DIAGNOSIS — Z23 Encounter for immunization: Secondary | ICD-10-CM | POA: Diagnosis not present

## 2019-08-20 DIAGNOSIS — F4320 Adjustment disorder, unspecified: Secondary | ICD-10-CM | POA: Diagnosis not present

## 2019-08-20 DIAGNOSIS — I10 Essential (primary) hypertension: Secondary | ICD-10-CM | POA: Diagnosis not present

## 2019-08-20 DIAGNOSIS — Z6829 Body mass index (BMI) 29.0-29.9, adult: Secondary | ICD-10-CM | POA: Diagnosis not present

## 2019-08-20 DIAGNOSIS — R7303 Prediabetes: Secondary | ICD-10-CM | POA: Diagnosis not present

## 2019-08-20 DIAGNOSIS — N189 Chronic kidney disease, unspecified: Secondary | ICD-10-CM | POA: Diagnosis not present

## 2019-08-20 DIAGNOSIS — E785 Hyperlipidemia, unspecified: Secondary | ICD-10-CM | POA: Diagnosis not present

## 2019-08-20 DIAGNOSIS — M130 Polyarthritis, unspecified: Secondary | ICD-10-CM | POA: Diagnosis not present

## 2019-08-26 DIAGNOSIS — H6123 Impacted cerumen, bilateral: Secondary | ICD-10-CM | POA: Diagnosis not present

## 2019-11-17 DIAGNOSIS — H43813 Vitreous degeneration, bilateral: Secondary | ICD-10-CM | POA: Diagnosis not present

## 2019-11-17 DIAGNOSIS — H04123 Dry eye syndrome of bilateral lacrimal glands: Secondary | ICD-10-CM | POA: Diagnosis not present

## 2019-11-19 DIAGNOSIS — I10 Essential (primary) hypertension: Secondary | ICD-10-CM | POA: Diagnosis not present

## 2019-12-11 DIAGNOSIS — H8309 Labyrinthitis, unspecified ear: Secondary | ICD-10-CM | POA: Diagnosis not present

## 2020-03-05 DIAGNOSIS — Z23 Encounter for immunization: Secondary | ICD-10-CM | POA: Diagnosis not present

## 2020-03-18 DIAGNOSIS — I10 Essential (primary) hypertension: Secondary | ICD-10-CM | POA: Diagnosis not present

## 2020-03-21 DIAGNOSIS — I10 Essential (primary) hypertension: Secondary | ICD-10-CM | POA: Diagnosis not present

## 2020-04-27 ENCOUNTER — Other Ambulatory Visit: Payer: Self-pay | Admitting: Family Medicine

## 2020-04-27 DIAGNOSIS — Z1231 Encounter for screening mammogram for malignant neoplasm of breast: Secondary | ICD-10-CM

## 2020-06-06 ENCOUNTER — Other Ambulatory Visit: Payer: Self-pay

## 2020-06-06 ENCOUNTER — Ambulatory Visit
Admission: RE | Admit: 2020-06-06 | Discharge: 2020-06-06 | Disposition: A | Discharge status: Home / Self Care | Payer: Medicare Other | Source: Ambulatory Visit | Attending: Family Medicine | Admitting: Family Medicine

## 2020-06-06 DIAGNOSIS — Z1231 Encounter for screening mammogram for malignant neoplasm of breast: Secondary | ICD-10-CM

## 2020-09-10 DIAGNOSIS — Z23 Encounter for immunization: Secondary | ICD-10-CM | POA: Diagnosis not present

## 2020-11-10 DIAGNOSIS — Z1152 Encounter for screening for COVID-19: Secondary | ICD-10-CM | POA: Diagnosis not present

## 2020-11-16 DIAGNOSIS — H43813 Vitreous degeneration, bilateral: Secondary | ICD-10-CM | POA: Diagnosis not present

## 2020-11-16 DIAGNOSIS — H04123 Dry eye syndrome of bilateral lacrimal glands: Secondary | ICD-10-CM | POA: Diagnosis not present

## 2020-12-20 DIAGNOSIS — Z1152 Encounter for screening for COVID-19: Secondary | ICD-10-CM | POA: Diagnosis not present

## 2021-01-11 DIAGNOSIS — Z1152 Encounter for screening for COVID-19: Secondary | ICD-10-CM | POA: Diagnosis not present

## 2021-01-16 DIAGNOSIS — F323 Major depressive disorder, single episode, severe with psychotic features: Secondary | ICD-10-CM | POA: Diagnosis not present

## 2021-01-16 DIAGNOSIS — I10 Essential (primary) hypertension: Secondary | ICD-10-CM | POA: Diagnosis not present

## 2021-01-16 DIAGNOSIS — F064 Anxiety disorder due to known physiological condition: Secondary | ICD-10-CM | POA: Diagnosis not present

## 2021-01-16 DIAGNOSIS — E782 Mixed hyperlipidemia: Secondary | ICD-10-CM | POA: Diagnosis not present

## 2021-01-16 DIAGNOSIS — L658 Other specified nonscarring hair loss: Secondary | ICD-10-CM | POA: Diagnosis not present

## 2021-01-16 DIAGNOSIS — Z0001 Encounter for general adult medical examination with abnormal findings: Secondary | ICD-10-CM | POA: Diagnosis not present

## 2021-01-17 DIAGNOSIS — N189 Chronic kidney disease, unspecified: Secondary | ICD-10-CM | POA: Diagnosis not present

## 2021-01-17 DIAGNOSIS — F4320 Adjustment disorder, unspecified: Secondary | ICD-10-CM | POA: Diagnosis not present

## 2021-01-17 DIAGNOSIS — E785 Hyperlipidemia, unspecified: Secondary | ICD-10-CM | POA: Diagnosis not present

## 2021-01-17 DIAGNOSIS — I10 Essential (primary) hypertension: Secondary | ICD-10-CM | POA: Diagnosis not present

## 2021-01-17 DIAGNOSIS — M130 Polyarthritis, unspecified: Secondary | ICD-10-CM | POA: Diagnosis not present

## 2021-01-17 DIAGNOSIS — R7303 Prediabetes: Secondary | ICD-10-CM | POA: Diagnosis not present

## 2021-02-02 DIAGNOSIS — Z23 Encounter for immunization: Secondary | ICD-10-CM | POA: Diagnosis not present

## 2021-02-11 DIAGNOSIS — Z1152 Encounter for screening for COVID-19: Secondary | ICD-10-CM | POA: Diagnosis not present

## 2021-04-17 DIAGNOSIS — M130 Polyarthritis, unspecified: Secondary | ICD-10-CM | POA: Diagnosis not present

## 2021-04-17 DIAGNOSIS — R5383 Other fatigue: Secondary | ICD-10-CM | POA: Diagnosis not present

## 2021-04-17 DIAGNOSIS — H6123 Impacted cerumen, bilateral: Secondary | ICD-10-CM | POA: Diagnosis not present

## 2021-06-02 ENCOUNTER — Other Ambulatory Visit: Payer: Self-pay | Admitting: Family Medicine

## 2021-06-02 DIAGNOSIS — Z1231 Encounter for screening mammogram for malignant neoplasm of breast: Secondary | ICD-10-CM

## 2021-06-07 ENCOUNTER — Ambulatory Visit
Admission: RE | Admit: 2021-06-07 | Discharge: 2021-06-07 | Disposition: A | Discharge status: Home / Self Care | Payer: Medicare Other | Source: Ambulatory Visit | Attending: Family Medicine | Admitting: Family Medicine

## 2021-06-07 DIAGNOSIS — Z1231 Encounter for screening mammogram for malignant neoplasm of breast: Secondary | ICD-10-CM | POA: Diagnosis not present

## 2021-07-17 DIAGNOSIS — Z6833 Body mass index (BMI) 33.0-33.9, adult: Secondary | ICD-10-CM | POA: Diagnosis not present

## 2021-07-17 DIAGNOSIS — L638 Other alopecia areata: Secondary | ICD-10-CM | POA: Diagnosis not present

## 2021-07-17 DIAGNOSIS — N189 Chronic kidney disease, unspecified: Secondary | ICD-10-CM | POA: Diagnosis not present

## 2021-07-17 DIAGNOSIS — E559 Vitamin D deficiency, unspecified: Secondary | ICD-10-CM | POA: Diagnosis not present

## 2021-07-17 DIAGNOSIS — E782 Mixed hyperlipidemia: Secondary | ICD-10-CM | POA: Diagnosis not present

## 2021-11-09 DIAGNOSIS — H43813 Vitreous degeneration, bilateral: Secondary | ICD-10-CM | POA: Diagnosis not present

## 2021-11-09 DIAGNOSIS — H04123 Dry eye syndrome of bilateral lacrimal glands: Secondary | ICD-10-CM | POA: Diagnosis not present

## 2021-12-07 DIAGNOSIS — F4381 Prolonged grief disorder: Secondary | ICD-10-CM | POA: Diagnosis not present

## 2021-12-07 DIAGNOSIS — L638 Other alopecia areata: Secondary | ICD-10-CM | POA: Diagnosis not present

## 2021-12-07 DIAGNOSIS — E782 Mixed hyperlipidemia: Secondary | ICD-10-CM | POA: Diagnosis not present

## 2021-12-07 DIAGNOSIS — F064 Anxiety disorder due to known physiological condition: Secondary | ICD-10-CM | POA: Diagnosis not present

## 2022-01-11 DIAGNOSIS — I10 Essential (primary) hypertension: Secondary | ICD-10-CM | POA: Diagnosis not present

## 2022-04-25 ENCOUNTER — Ambulatory Visit: Payer: Medicare Other | Admitting: Family Medicine

## 2022-05-18 ENCOUNTER — Telehealth: Payer: Self-pay | Admitting: Family Medicine

## 2022-05-18 NOTE — Telephone Encounter (Signed)
Attempt to reach pt. Left a voicemail to call us back.  

## 2022-05-18 NOTE — Telephone Encounter (Signed)
Pt is asking that MD call previous provider to request medical records  Dr. Clayburn Pert [phone] - Call back   Pt would like a call back when it is completed.

## 2022-05-22 NOTE — Telephone Encounter (Signed)
Spoke to pt. Inform her she can sign the record release form at her visit with Dr. Volanda Napoleon. Verbalized understanding.

## 2022-07-23 ENCOUNTER — Other Ambulatory Visit: Payer: Self-pay | Admitting: Family Medicine

## 2022-07-23 ENCOUNTER — Ambulatory Visit (INDEPENDENT_AMBULATORY_CARE_PROVIDER_SITE_OTHER): Payer: Medicare Other | Admitting: Family Medicine

## 2022-07-23 VITALS — BP 124/66 | HR 62 | Temp 98.8°F | Ht 63.0 in | Wt 194.0 lb

## 2022-07-23 DIAGNOSIS — I499 Cardiac arrhythmia, unspecified: Secondary | ICD-10-CM

## 2022-07-23 DIAGNOSIS — Z1231 Encounter for screening mammogram for malignant neoplasm of breast: Secondary | ICD-10-CM

## 2022-07-23 DIAGNOSIS — L659 Nonscarring hair loss, unspecified: Secondary | ICD-10-CM | POA: Diagnosis not present

## 2022-07-23 DIAGNOSIS — H6123 Impacted cerumen, bilateral: Secondary | ICD-10-CM | POA: Diagnosis not present

## 2022-07-23 DIAGNOSIS — E785 Hyperlipidemia, unspecified: Secondary | ICD-10-CM | POA: Diagnosis not present

## 2022-07-23 DIAGNOSIS — H9193 Unspecified hearing loss, bilateral: Secondary | ICD-10-CM

## 2022-07-23 DIAGNOSIS — M1990 Unspecified osteoarthritis, unspecified site: Secondary | ICD-10-CM | POA: Diagnosis not present

## 2022-07-23 DIAGNOSIS — L299 Pruritus, unspecified: Secondary | ICD-10-CM

## 2022-07-23 DIAGNOSIS — Z7689 Persons encountering health services in other specified circumstances: Secondary | ICD-10-CM

## 2022-07-23 NOTE — Progress Notes (Addendum)
Established Patient Office Visit   Subjective  Patient ID: [name], female    DOB: 01/31/1941  Age: 82 y.o. MRN: [id number]  Chief Complaint  Patient presents with   Establish Care   Pt accompanied by her daughter Pt is an 83 yo female seen for chronic conditions.  Patient previously seen by Renaye Rakers, MD.  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  Hair loss: Centrally located  Hearing issues: No issues noted at audiology.  Has tinnitus.  Endorses hearing a mild buzzing sound.  If lays a certain way does not hear the buzzing.  Seen by ENT in 2014, Dr. Pollyann Kennedy.  No recent ENT follow-up.  HLD: Taking omega-3 1 g 4 times daily and Lovaza?    Past Medical History:  Diagnosis Date   Chronic kidney disease    Past Surgical History:  Procedure Laterality Date   CAPSULOTOMY Right 07/25/2012   Procedure: MINOR PROCEDURE YAG LASER CAPSULOTOMY;  Surgeon: Vita Erm., MD;  Location: [facility] OR;  Service: Ophthalmology;  Laterality: Right;   CATARACT EXTRACTION W/PHACO Left 04/27/2015   Procedure: CATARACT EXTRACTION PHACO AND INTRAOCULAR LENS PLACEMENT (IOC) LEFT EYE;  Surgeon: Chalmers Guest, MD;  Location: [facility] OR;  Service: Ophthalmology;  Laterality: Left;   COLONOSCOPY     EYE SURGERY Right    Caract   Social History   Tobacco Use   Smoking status: Former    Years: 18    Types: Cigarettes   Tobacco comments:    quit in 1995  Substance Use Topics   Alcohol use: No   Drug use: No   Family History  Problem Relation Age of Onset   Obesity Daughter    No Known Allergies    ROS Negative unless stated above    Objective:     BP 124/66 (BP Location: Left Arm, Patient Position: Sitting, Cuff Size: Large)   Pulse 62   Temp 98.8 F (37.1 C) (Oral)   Ht 5\' 3"  (1.6 m)   Wt 194 lb (88 kg)   SpO2 97%   BMI 34.37 kg/m    Physical Exam Constitutional:      General: She is not in acute distress.    Appearance: Normal appearance.  HENT:     Head:  Normocephalic and atraumatic.     Nose: Nose normal.     Mouth/Throat:     Mouth: Mucous membranes are moist.  Eyes:     Extraocular Movements: Extraocular movements intact.     Conjunctiva/sclera: Conjunctivae normal.     Pupils: Pupils are equal, round, and reactive to light.  Cardiovascular:     Rate and Rhythm: Normal rate. Rhythm irregular.     Heart sounds: Normal heart sounds. No murmur heard.    No gallop.  Pulmonary:     Effort: Pulmonary effort is normal. No respiratory distress.     Breath sounds: Normal breath sounds. No wheezing, rhonchi or rales.  Abdominal:     General: Bowel sounds are normal.     Palpations: Abdomen is soft.  Skin:    General: Skin is warm and dry.  Neurological:     Mental Status: She is alert and oriented to person, place, and time.     No results found for any visits on 07/23/22.    Assessment & Plan:  Irregular heartbeat -Asymptomatic -Appreciated on exam -EKG in office with NSR similar to previous study from 02/16/2018 -     EKG 12-Lead  Encounter to establish  care -We reviewed the PMH, PSH, FH, SH, Meds and Allergies. -We provided refills for any medications we will prescribe as needed. -We addressed current concerns per orders and patient instructions. -We have asked for records for pertinent exams, studies, vaccines and notes from previous providers. -We have advised patient to follow up per instructions below.  Bilateral hearing loss, unspecified hearing loss type -     Ambulatory referral to Audiology  Bilateral impacted cerumen -OTC Debrox eardrops  Hair loss -Discussed follow-up with dermatology  Arthritis -Bilateral hands -Supportive care including topical analgesics, Tylenol, heat, etc.  Hyperlipidemia, unspecified hyperlipidemia type -Lifestyle modifications -Continue fish oil tablets  Pruritus -discussed possible causes including dry skin, allergies, medications, -Supportive care -Follow-up with  dermatology for continued or worsening symptoms  Return in about 8 weeks (around 09/17/2022).  Obtain labs at next [facility].  Deeann Saint, MD

## 2022-07-23 NOTE — Patient Instructions (Signed)
Your EKG that was done today is similar to when you had on 02/20/2018.  No acute heart changes noted.

## 2022-07-31 ENCOUNTER — Ambulatory Visit: Payer: Medicare Other | Attending: Audiologist | Admitting: Audiologist

## 2022-07-31 DIAGNOSIS — H903 Sensorineural hearing loss, bilateral: Secondary | ICD-10-CM | POA: Insufficient documentation

## 2022-07-31 NOTE — Procedures (Signed)
  Outpatient Audiology and [place], [place]  [address] [phone]  AUDIOLOGICAL  EVALUATION  NAME: [name]     DOB:   1940-11-13      MRN: [id number]                                                                                     DATE: 07/31/2022     REFERENT: Billie Ruddy, MD STATUS: Outpatient DIAGNOSIS: Sensorineural Hearing Loss Bilateral    History: [name] was seen for an audiological evaluation due to difficulty hearing people on occasion. Her daughter has ben telling Man to get a hearing test. [name] feels she hears ok. He has occasional ringing in her ears. She had wax removed recently. No pain or pressure. No history of excessive noise exposure.   Evaluation:  Otoscopy showed a clear view of the tympanic membranes, bilaterally Tympanometry results were consistent with normal middle ear function, bilaterally   Audiometric testing was completed using Conventional Audiometry techniques with TDH headphones. Test results are consistent with normal hearing sloping after 2kHz to a moderate high frequency sensorineural hearing loss. Speech Recognition Thresholds were obtained at  dB HL in the right ear and at 25dB HL in the left ear. Word Recognition Testing was completed at  40dB SL and [name] scored 100% in the right ear and 92% in the right ear.    Results:  The test results were reviewed with [name] and her daughter. [name] needs hearing aids. She has a high pitched sensorineural hearing loss in both ears.   Recommendations: 1.   Hearing aids necessary for both ears. See list of local providers. Bring printed audiogram provided today.   35 minutes spent testing and counseling on results.   If you have any questions please feel free to contact me at [phone].  Alfonse Alpers Audiologist, Au.D., CCC-A 07/31/2022  2:53 PM  Cc: Billie Ruddy, MD

## 2022-08-06 ENCOUNTER — Telehealth: Payer: Self-pay | Admitting: Family Medicine

## 2022-08-06 NOTE — Telephone Encounter (Signed)
Patient returned my call.  She stated she already had this done at her previous provider.  She will call me back with that date

## 2022-08-06 NOTE — Telephone Encounter (Signed)
If patient is no longer having wax in her ears that she does not need to use the Debrox.

## 2022-08-06 NOTE — Telephone Encounter (Signed)
Called patient to schedule Medicare Annual Wellness Visit (AWV). Left message for patient to call back and schedule Medicare Annual Wellness Visit (AWV).  Last date of AWV: awvs 12/29/21 per [facility]   Please schedule an appointment at any time with [facility] or [facility].  If any questions, please contact me at [phone].  Thank you ,  Rudell Cobb AWV direct phone # [phone]

## 2022-08-06 NOTE — Telephone Encounter (Signed)
Patient was told to use Debrox in her ears, seeking clarification as to how long she should continue. Says the audiologist told her it was not wax in her ears, it was dead skin. Please advise.

## 2022-08-07 ENCOUNTER — Encounter: Payer: Self-pay | Admitting: Family Medicine

## 2022-08-07 ENCOUNTER — Telehealth: Payer: Self-pay | Admitting: Family Medicine

## 2022-08-07 NOTE — Telephone Encounter (Signed)
Contacted [name] to schedule their annual wellness visit. Appointment made for 08/10/22.  Rudell Cobb AWV direct phone # [phone]

## 2022-08-07 NOTE — Telephone Encounter (Signed)
error 

## 2022-08-07 NOTE — Telephone Encounter (Signed)
Patient is aware 

## 2022-08-10 ENCOUNTER — Ambulatory Visit (INDEPENDENT_AMBULATORY_CARE_PROVIDER_SITE_OTHER): Payer: Medicare Other

## 2022-08-10 VITALS — Ht 63.0 in | Wt 193.0 lb

## 2022-08-10 DIAGNOSIS — Z13820 Encounter for screening for osteoporosis: Secondary | ICD-10-CM

## 2022-08-10 DIAGNOSIS — Z0000 Encounter for general adult medical examination without abnormal findings: Secondary | ICD-10-CM

## 2022-08-10 NOTE — Progress Notes (Signed)
Subjective:   [name] is a 82 y.o. female who presents for Medicare Annual (Subsequent) preventive examination.  Review of Systems    Virtual Visit via Telephone Note  I connected with  [name] on 08/10/22 at  3:45 PM EDT by telephone and verified that I am speaking with the correct person using two identifiers.  Location: Patient: Home Provider: Office Persons participating in the virtual visit: patient/Nurse Health Advisor   I discussed the limitations, risks, security and privacy concerns of performing an evaluation and management service by telephone and the availability of in person appointments. The patient expressed understanding and agreed to proceed.  Interactive audio and video telecommunications were attempted between this nurse and patient, however failed, due to patient having technical difficulties OR patient did not have access to video capability.  We continued and completed visit with audio only.  Some vital signs may be absent or patient reported.   Tillie Rung, LPN  Cardiac Risk Factors include: advanced age (>64men, >69 women)     Objective:    Today's Vitals   08/10/22 1551  Weight: 193 lb (87.5 kg)  Height: 5\' 3"  (1.6 m)   Body mass index is 34.19 kg/m.     08/10/2022    4:01 PM 02/21/2018    1:01 AM 04/11/2017   10:20 AM 04/27/2015    7:53 AM 06/21/2014    9:45 PM  Advanced Directives  Does Patient Have a Medical Advance Directive? Yes No Yes Yes No  Type of Estate agent of [place];Living will  Living will Healthcare Power of [place];Living will   Does patient want to make changes to medical advance directive?    No - Patient declined   Copy of Healthcare Power of Attorney in Chart? No - copy requested   No - copy requested   Would patient like information on creating a medical advance directive?  Yes (Inpatient - patient requests chaplain consult to create a medical advance directive)   No -  patient declined information    Current Medications (verified) Outpatient Encounter Medications as of 08/10/2022  Medication Sig   Acetaminophen (TYLENOL PO) Take by mouth.   Calcium Carbonate-Vitamin D (CALCIUM 600 + D PO) Take 1 tablet by mouth daily.   carbamide peroxide (DEBROX) 6.5 % OTIC solution Place 5 drops into the left ear 2 (two) times daily.   clobetasol ointment (TEMOVATE) 0.05 % Apply 1 application topically daily as needed (flare up).    Flaxseed, Linseed, (FLAX SEEDS PO) Take by mouth.   ILEVRO 0.3 % ophthalmic suspension Place 1 drop into the left eye daily. (Patient not taking: Reported on 07/23/2022)   Multiple Vitamins-Minerals (CENTRUM SILVER ADULT 50+ PO) Take 1 tablet by mouth daily.   omega-3 acid ethyl esters (LOVAZA) 1 G capsule Take 1 g by mouth 4 (four) times daily.    Polyethyl Glycol-Propyl Glycol (SYSTANE OP) Place 1-2 drops into both eyes 2 (two) times daily.   TURMERIC PO Take 1 tablet by mouth 2 (two) times daily. With ginger   No facility-administered encounter medications on file as of 08/10/2022.    Allergies (verified) Patient has no known allergies.   History: Past Medical History:  Diagnosis Date   Chronic kidney disease    Past Surgical History:  Procedure Laterality Date   CAPSULOTOMY Right 07/25/2012   Procedure: MINOR PROCEDURE YAG LASER CAPSULOTOMY;  Surgeon: Vita Erm., MD;  Location: [facility] OR;  Service: Ophthalmology;  Laterality: Right;  CATARACT EXTRACTION W/PHACO Left 04/27/2015   Procedure: CATARACT EXTRACTION PHACO AND INTRAOCULAR LENS PLACEMENT (IOC) LEFT EYE;  Surgeon: Chalmers Guest, MD;  Location: [facility] OR;  Service: Ophthalmology;  Laterality: Left;   COLONOSCOPY     EYE SURGERY Right    Caract   Family History  Problem Relation Age of Onset   Obesity Daughter    Social History   Socioeconomic History   Marital status: Widowed    Spouse name: Not on file   Number of children: Not on file   Years of education:  Not on file   Highest education level: Not on file  Occupational History   Not on file  Tobacco Use   Smoking status: Former    Years: 18    Types: Cigarettes   Smokeless tobacco: Not on file   Tobacco comments:    quit in 1995  Substance and Sexual Activity   Alcohol use: No   Drug use: No   Sexual activity: Not on file  Other Topics Concern   Not on file  Social History Narrative   Not on file   Social Determinants of Health   Financial Resource Strain: Low Risk  (08/10/2022)   Overall Financial Resource Strain (CARDIA)    Difficulty of Paying Living Expenses: Not hard at all  Food Insecurity: No Food Insecurity (08/10/2022)   Hunger Vital Sign    Worried About Running Out of Food in the Last Year: Never true    Ran Out of Food in the Last Year: Never true  Transportation Needs: No Transportation Needs (08/10/2022)   PRAPARE - Administrator, Civil Service (Medical): No    Lack of Transportation (Non-Medical): No  Physical Activity: Insufficiently Active (08/10/2022)   Exercise Vital Sign    Days of Exercise per Week: 3 days    Minutes of Exercise per Session: 30 min  Stress: No Stress Concern Present (08/10/2022)   [employer] of Occupational Health - Occupational Stress Questionnaire    Feeling of Stress : Not at all  Social Connections: Moderately Integrated (08/10/2022)   Social Connection and Isolation Panel [NHANES]    Frequency of Communication with Friends and Family: More than three times a week    Frequency of Social Gatherings with Friends and Family: More than three times a week    Attends Religious Services: More than 4 times per year    Active Member of [employer] or Organizations: Yes    Attends Banker Meetings: More than 4 times per year    Marital Status: Widowed    Tobacco Counseling Counseling given: Not Answered Tobacco comments: quit in 1995   Clinical Intake:  Pre-visit preparation completed: No  Pain : No/denies  pain     BMI - recorded: 34.19 Nutritional Status: BMI > 30  Obese Nutritional Risks: None Diabetes: No  How often do you need to have someone help you when you read instructions, pamphlets, or other written materials from your doctor or pharmacy?: 1 - Never  Diabetic?  No  Interpreter Needed?: No  Information entered by :: Theresa Mulligan LPN   Activities of Daily Living    08/10/2022    3:57 PM  In your present state of health, do you have any difficulty performing the following activities:  Hearing? 0  Vision? 0  Difficulty concentrating or making decisions? 0  Walking or climbing stairs? 0  Dressing or bathing? 0  Doing errands, shopping? 0  Preparing Food and eating ?  N  Using the Toilet? N  In the past six months, have you accidently leaked urine? Y  Comment Followed by Dr Parke Simmers  Do you have problems with loss of bowel control? N  Managing your Medications? N  Managing your Finances? N  Housekeeping or managing your Housekeeping? N    Patient Care Team: Deeann Saint, MD as PCP - General (Family Medicine)  Indicate any recent Medical Services you may have received from other than [facility] providers in the past year (date may be approximate).     Assessment:   This is a routine wellness examination for [name].  Hearing/Vision screen Hearing Screening - Comments:: Denies hearing difficulties   Vision Screening - Comments:: Wears rx glasses - up to date with routine eye exams with  Dr Clare Gandy  Dietary issues and exercise activities discussed: Exercise limited by: None identified   Goals Addressed               This Visit's Progress     Lose weight (pt-stated)         Depression Screen    08/10/2022    3:56 PM 07/23/2022   10:10 AM  PHQ 2/9 Scores  PHQ - 2 Score 0 0  PHQ- 9 Score 0 0    Fall Risk    08/10/2022    3:59 PM 07/23/2022   10:10 AM 11/28/2018    5:36 PM  Fall Risk   Falls in the past year? 0 0 0  Comment   Emmi Telephone  Survey: data to providers prior to load  Number falls in past yr: 0 0   Injury with Fall? 0 0   Risk for fall due to : No Fall Risks No Fall Risks   Follow up Falls prevention discussed Falls evaluation completed     FALL RISK PREVENTION PERTAINING TO THE HOME:  Any stairs in or around the home? Yes  If so, are there any without handrails? No  Home free of loose throw rugs in walkways, pet beds, electrical cords, etc? Yes  Adequate lighting in your home to reduce risk of falls? Yes   ASSISTIVE DEVICES UTILIZED TO PREVENT FALLS:  Life alert? No  Use of a cane, walker or w/c? No  Grab bars in the bathroom? Yes  Shower chair or bench in shower? Yes  Elevated toilet seat or a handicapped toilet? No   TIMED UP AND GO:  Was the test performed? No . Audio visit   Cognitive Function:        08/10/2022    4:01 PM  6CIT Screen  What Year? 0 points  What month? 0 points  What time? 0 points  Count back from 20 0 points  Months in reverse 0 points  Repeat phrase 0 points  Total Score 0 points    Immunizations Immunization History  Administered Date(s) Administered   Influenza-Unspecified 03/30/2018   Tdap 12/07/2015    TDAP status: Up to date  Flu Vaccine status: Up to date   Covid-19 vaccine status: Completed vaccines  Qualifies for Shingles Vaccine? Yes   Zostavax completed Yes   Shingrix Completed?: Yes  Screening Tests Health Maintenance  Topic Date Due   DEXA SCAN  Never done   COVID-19 Vaccine (1) 08/26/2022 (Originally 05/24/1941)   Zoster Vaccines- Shingrix (1 of 2) 11/09/2022 (Originally 11/20/1988)   Pneumonia Vaccine 7+ Years old (1 of 1 - PCV) 08/10/2023 (Originally 11/21/2003)   INFLUENZA VACCINE  11/29/2022   Medicare Annual Wellness (  AWV)  08/10/2023   DTaP/Tdap/Td (2 - Td or Tdap) 12/06/2025   HPV VACCINES  Aged Out    Health Maintenance  Health Maintenance Due  Topic Date Due   DEXA SCAN  Never done    Colorectal cancer screening: No  longer required.   Mammogram status: No longer required due to Age.  Bone Density status: Ordered 08/10/22. Pt provided with contact info and advised to call to schedule appt.  Lung Cancer Screening: (Low Dose CT Chest recommended if Age 70-80 years, 30 pack-year currently smoking OR have quit w/in 15years.) does not qualify.  Additional Screening:  Hepatitis C Screening: does not qualify; Completed   Vision Screening: Recommended annual ophthalmology exams for early detection of glaucoma and other disorders of the eye. Is the patient up to date with their annual eye exam?  Yes  Who is the provider or what is the name of the office in which the patient attends annual eye exams? Dr Clare Gandy If pt is not established with a provider, would they like to be referred to a provider to establish care? No .   Dental Screening: Recommended annual dental exams for proper oral hygiene  Community Resource Referral / Chronic Care Management:  CRR required this visit?  No   CCM required this visit?  No      Plan:     I have personally reviewed and noted the following in the patient's chart:   Medical and social history Use of alcohol, tobacco or illicit drugs  Current medications and supplements including opioid prescriptions. Patient is not currently taking opioid prescriptions. Functional ability and status Nutritional status Physical activity Advanced directives List of other physicians Hospitalizations, surgeries, and ER visits in previous 12 months Vitals Screenings to include cognitive, depression, and falls Referrals and appointments  In addition, I have reviewed and discussed with patient certain preventive protocols, quality metrics, and best practice recommendations. A written personalized care plan for preventive services as well as general preventive health recommendations were provided to patient.     Tillie Rung, LPN   12/08/9145   Nurse Notes: None

## 2022-08-10 NOTE — Patient Instructions (Addendum)
[name] , Thank you for taking time to come for your Medicare Wellness Visit. I appreciate your ongoing commitment to your health goals. Please review the following plan we discussed and let me know if I can assist you in the future.   These are the goals we discussed:  Goals       Lose weight (pt-stated)        This is a list of the screening recommended for you and due dates:  Health Maintenance  Topic Date Due   DEXA scan (bone density measurement)  Never done   COVID-19 Vaccine (1) 08/26/2022*   Zoster (Shingles) Vaccine (1 of 2) 11/09/2022*   Pneumonia Vaccine (1 of 1 - PCV) 08/10/2023*   Flu Shot  11/29/2022   Medicare Annual Wellness Visit  08/10/2023   DTaP/Tdap/Td vaccine (2 - Td or Tdap) 12/06/2025   HPV Vaccine  Aged Out  *Topic was postponed. The date shown is not the original due date.    Advanced directives: Please bring a copy of your health care power of attorney and living will to the office to be added to your chart at your convenience.   Conditions/risks identified: None  Next appointment: Follow up in one year for your annual wellness visit     Preventive Care 65 Years and Older, Female Preventive care refers to lifestyle choices and visits with your health care provider that can promote health and wellness. What does preventive care include? A yearly physical exam. This is also called an annual well check. Dental exams once or twice a year. Routine eye exams. Ask your health care provider how often you should have your eyes checked. Personal lifestyle choices, including: Daily care of your teeth and gums. Regular physical activity. Eating a healthy diet. Avoiding tobacco and drug use. Limiting alcohol use. Practicing safe sex. Taking low-dose aspirin every day. Taking vitamin and mineral supplements as recommended by your health care provider. What happens during an annual well check? The services and screenings done by your health care  provider during your annual well check will depend on your age, overall health, lifestyle risk factors, and family history of disease. Counseling  Your health care provider may ask you questions about your: Alcohol use. Tobacco use. Drug use. Emotional well-being. Home and relationship well-being. Sexual activity. Eating habits. History of falls. Memory and ability to understand (cognition). Work and work Astronomer. Reproductive health. Screening  You may have the following tests or measurements: Height, weight, and BMI. Blood pressure. Lipid and cholesterol levels. These may be checked every 5 years, or more frequently if you are over 2 years old. Skin check. Lung cancer screening. You may have this screening every year starting at age 85 if you have a 30-pack-year history of smoking and currently smoke or have quit within the past 15 years. Fecal occult blood test (FOBT) of the stool. You may have this test every year starting at age 84. Flexible sigmoidoscopy or colonoscopy. You may have a sigmoidoscopy every 5 years or a colonoscopy every 10 years starting at age 63. Hepatitis C blood test. Hepatitis B blood test. Sexually transmitted disease (STD) testing. Diabetes screening. This is done by checking your blood sugar (glucose) after you have not eaten for a while (fasting). You may have this done every 1-3 years. Bone density scan. This is done to screen for osteoporosis. You may have this done starting at age 36. Mammogram. This may be done every 1-2 years. Talk to your health care  provider about how often you should have regular mammograms. Talk with your health care provider about your test results, treatment options, and if necessary, the need for more tests. Vaccines  Your health care provider may recommend certain vaccines, such as: Influenza vaccine. This is recommended every year. Tetanus, diphtheria, and acellular pertussis (Tdap, Td) vaccine. You may need a Td  booster every 10 years. Zoster vaccine. You may need this after age 54. Pneumococcal 13-valent conjugate (PCV13) vaccine. One dose is recommended after age 9. Pneumococcal polysaccharide (PPSV23) vaccine. One dose is recommended after age 10. Talk to your health care provider about which screenings and vaccines you need and how often you need them. This information is not intended to replace advice given to you by your health care provider. Make sure you discuss any questions you have with your health care provider. Document Released: 05/13/2015 Document Revised: 01/04/2016 Document Reviewed: 02/15/2015 Elsevier Interactive Patient Education  2017 [place] Prevention in the Home Falls can cause injuries. They can happen to people of all ages. There are many things you can do to make your home safe and to help prevent falls. What can I do on the outside of my home? Regularly fix the edges of walkways and driveways and fix any cracks. Remove anything that might make you trip as you walk through a door, such as a raised step or threshold. Trim any bushes or trees on the path to your home. Use bright outdoor lighting. Clear any walking paths of anything that might make someone trip, such as rocks or tools. Regularly check to see if handrails are loose or broken. Make sure that both sides of any steps have handrails. Any raised decks and porches should have guardrails on the edges. Have any leaves, snow, or ice cleared regularly. Use sand or salt on walking paths during winter. Clean up any spills in your garage right away. This includes oil or grease spills. What can I do in the bathroom? Use night lights. Install grab bars by the toilet and in the tub and shower. Do not use towel bars as grab bars. Use non-skid mats or decals in the tub or shower. If you need to sit down in the shower, use a plastic, non-slip stool. Keep the floor dry. Clean up any water that spills on the floor  as soon as it happens. Remove soap buildup in the tub or shower regularly. Attach bath mats securely with double-sided non-slip rug tape. Do not have throw rugs and other things on the floor that can make you trip. What can I do in the bedroom? Use night lights. Make sure that you have a light by your bed that is easy to reach. Do not use any sheets or blankets that are too big for your bed. They should not hang down onto the floor. Have a firm chair that has side arms. You can use this for support while you get dressed. Do not have throw rugs and other things on the floor that can make you trip. What can I do in the kitchen? Clean up any spills right away. Avoid walking on wet floors. Keep items that you use a lot in easy-to-reach places. If you need to reach something above you, use a strong step stool that has a grab bar. Keep electrical cords out of the way. Do not use floor polish or wax that makes floors slippery. If you must use wax, use non-skid floor wax. Do not have throw rugs  and other things on the floor that can make you trip. What can I do with my stairs? Do not leave any items on the stairs. Make sure that there are handrails on both sides of the stairs and use them. Fix handrails that are broken or loose. Make sure that handrails are as long as the stairways. Check any carpeting to make sure that it is firmly attached to the stairs. Fix any carpet that is loose or worn. Avoid having throw rugs at the top or bottom of the stairs. If you do have throw rugs, attach them to the floor with carpet tape. Make sure that you have a light switch at the top of the stairs and the bottom of the stairs. If you do not have them, ask someone to add them for you. What else can I do to help prevent falls? Wear shoes that: Do not have high heels. Have rubber bottoms. Are comfortable and fit you well. Are closed at the toe. Do not wear sandals. If you use a stepladder: Make sure that it is  fully opened. Do not climb a closed stepladder. Make sure that both sides of the stepladder are locked into place. Ask someone to hold it for you, if possible. Clearly mark and make sure that you can see: Any grab bars or handrails. First and last steps. Where the edge of each step is. Use tools that help you move around (mobility aids) if they are needed. These include: Canes. Walkers. Scooters. Crutches. Turn on the lights when you go into a dark area. Replace any light bulbs as soon as they burn out. Set up your furniture so you have a clear path. Avoid moving your furniture around. If any of your floors are uneven, fix them. If there are any pets around you, be aware of where they are. Review your medicines with your doctor. Some medicines can make you feel dizzy. This can increase your chance of falling. Ask your doctor what other things that you can do to help prevent falls. This information is not intended to replace advice given to you by your health care provider. Make sure you discuss any questions you have with your health care provider. Document Released: 02/10/2009 Document Revised: 09/22/2015 Document Reviewed: 05/21/2014 Elsevier Interactive Patient Education  2017 [employer].

## 2022-08-29 ENCOUNTER — Encounter: Payer: Self-pay | Admitting: Family Medicine

## 2022-09-17 DIAGNOSIS — L668 Other cicatricial alopecia: Secondary | ICD-10-CM | POA: Diagnosis not present

## 2022-09-18 ENCOUNTER — Ambulatory Visit
Admission: RE | Admit: 2022-09-18 | Discharge: 2022-09-18 | Disposition: A | Discharge status: Home / Self Care | Payer: Medicare Other | Source: Ambulatory Visit | Attending: Family Medicine | Admitting: Family Medicine

## 2022-09-18 DIAGNOSIS — Z1231 Encounter for screening mammogram for malignant neoplasm of breast: Secondary | ICD-10-CM

## 2022-09-19 ENCOUNTER — Encounter: Payer: Self-pay | Admitting: Family Medicine

## 2022-09-19 ENCOUNTER — Ambulatory Visit (INDEPENDENT_AMBULATORY_CARE_PROVIDER_SITE_OTHER): Payer: Medicare Other | Admitting: Family Medicine

## 2022-09-19 VITALS — BP 138/76 | HR 65 | Temp 98.0°F | Wt 194.0 lb

## 2022-09-19 DIAGNOSIS — E782 Mixed hyperlipidemia: Secondary | ICD-10-CM | POA: Diagnosis not present

## 2022-09-19 DIAGNOSIS — E669 Obesity, unspecified: Secondary | ICD-10-CM

## 2022-09-19 DIAGNOSIS — H903 Sensorineural hearing loss, bilateral: Secondary | ICD-10-CM

## 2022-09-19 DIAGNOSIS — Z6834 Body mass index (BMI) 34.0-34.9, adult: Secondary | ICD-10-CM

## 2022-09-19 DIAGNOSIS — L6681 Central centrifugal cicatricial alopecia: Secondary | ICD-10-CM | POA: Insufficient documentation

## 2022-09-19 DIAGNOSIS — L668 Other cicatricial alopecia: Secondary | ICD-10-CM | POA: Diagnosis not present

## 2022-09-19 LAB — COMPREHENSIVE METABOLIC PANEL
ALT: 14 U/L (ref 0–35)
AST: 22 U/L (ref 0–37)
Albumin: 4.2 g/dL (ref 3.5–5.2)
Alkaline Phosphatase: 96 U/L (ref 39–117)
BUN: 13 mg/dL (ref 6–23)
CO2: 30 mEq/L (ref 19–32)
Calcium: 10.2 mg/dL (ref 8.4–10.5)
Chloride: 103 mEq/L (ref 96–112)
Creatinine, Ser: 1.04 mg/dL (ref 0.40–1.20)
GFR: 49.59 mL/min — ABNORMAL LOW (ref >60.00)
Glucose, Bld: 112 mg/dL — ABNORMAL HIGH (ref 70–99)
Potassium: 4.8 mEq/L (ref 3.5–5.1)
Sodium: 139 mEq/L (ref 135–145)
Total Bilirubin: 0.6 mg/dL (ref 0.2–1.2)
Total Protein: 7.2 g/dL (ref 6.0–8.3)

## 2022-09-19 LAB — CBC WITH DIFFERENTIAL/PLATELET
Basophils Absolute: 0.1 10*3/uL (ref 0.0–0.1)
Basophils Relative: 1.9 % (ref 0.0–3.0)
Eosinophils Absolute: 0.1 10*3/uL (ref 0.0–0.7)
Eosinophils Relative: 3.9 % (ref 0.0–5.0)
HCT: 41.3 % (ref 36.0–46.0)
Hemoglobin: 13.3 g/dL (ref 12.0–15.0)
Lymphocytes Relative: 39.3 % (ref 12.0–46.0)
Lymphs Abs: 1.4 10*3/uL (ref 0.7–4.0)
MCHC: 32.2 g/dL (ref 30.0–36.0)
MCV: 89.2 fl (ref 78.0–100.0)
Monocytes Absolute: 0.4 10*3/uL (ref 0.1–1.0)
Monocytes Relative: 12.1 % — ABNORMAL HIGH (ref 3.0–12.0)
Neutro Abs: 1.5 10*3/uL (ref 1.4–7.7)
Neutrophils Relative %: 42.8 % — ABNORMAL LOW (ref 43.0–77.0)
Platelets: 220 10*3/uL (ref 150.0–400.0)
RBC: 4.63 Mil/uL (ref 3.87–5.11)
RDW: 15.7 % — ABNORMAL HIGH (ref 11.5–15.5)
WBC: 3.6 10*3/uL — ABNORMAL LOW (ref 4.0–10.5)

## 2022-09-19 LAB — LIPID PANEL
Cholesterol: 199 mg/dL (ref 0–200)
HDL: 63.3 mg/dL (ref >39.00)
LDL Cholesterol: 116 mg/dL — ABNORMAL HIGH (ref 0–99)
NonHDL: 135.53
Total CHOL/HDL Ratio: 3
Triglycerides: 97 mg/dL (ref 0.0–149.0)
VLDL: 19.4 mg/dL (ref 0.0–40.0)

## 2022-09-19 LAB — TSH: TSH: 4.87 u[IU]/mL (ref 0.35–5.50)

## 2022-09-19 LAB — VITAMIN D 25 HYDROXY (VIT D DEFICIENCY, FRACTURES): VITD: 52.19 ng/mL (ref 30.00–100.00)

## 2022-09-19 MED ORDER — OMEGA-3-ACID ETHYL ESTERS 1 G PO CAPS: 1.0000 g | ORAL_CAPSULE | Freq: Four times a day (QID) | ORAL | 3 refills | Status: DC

## 2022-09-19 NOTE — Progress Notes (Signed)
Established Patient Office Visit   Subjective  Patient ID: [name], female    DOB: [birth year]  Age: 82 y.o. MRN: [id number]  Chief Complaint  Patient presents with   Medical Management of Chronic Issues    Did hearing test, got hearing aids, states she is doing pretty good.     Patient is an 82 year old female seen for follow-up.  Patient states since last visit she was seen by dermatology for hair loss.  Given creams for central centrifugal scarring alopecia.  Patient notes some improvement in hair.  Also seen by audiologist.  Got hearing aids.  Still getting adjusted to them as they often fall out easily especially when wearing glasses.  Patient would like to have labs done this visit.  Taking fish oil tablet 4 times daily.  Needs refill sent to [facility].  Patient is not fasting this morning, had yogurt, coffee.  Patient had mammogram yesterday.  Bone density scheduled for November 2024.  Patient had shingles vaccine last week at the pharmacy.    Past Medical History:  Diagnosis Date   Chronic kidney disease    Past Surgical History:  Procedure Laterality Date   CAPSULOTOMY Right 07/25/2012   Procedure: MINOR PROCEDURE YAG LASER CAPSULOTOMY;  Surgeon: Vita Erm., MD;  Location: [facility] OR;  Service: Ophthalmology;  Laterality: Right;   CATARACT EXTRACTION W/PHACO Left 04/27/2015   Procedure: CATARACT EXTRACTION PHACO AND INTRAOCULAR LENS PLACEMENT (IOC) LEFT EYE;  Surgeon: Chalmers Guest, MD;  Location: [facility] OR;  Service: Ophthalmology;  Laterality: Left;   COLONOSCOPY     EYE SURGERY Right    Caract   Social History   Tobacco Use   Smoking status: Former    Years: 18    Types: Cigarettes   Tobacco comments:    quit in 1995  Substance Use Topics   Alcohol use: No   Drug use: No   Family History  Problem Relation Age of Onset   Obesity Daughter    No Known Allergies    ROS Negative unless stated above    Objective:     BP 138/76 (BP  Location: Left Arm, Patient Position: Sitting, Cuff Size: Normal)   Pulse 65   Temp 98 F (36.7 C) (Oral)   Wt 194 lb (88 kg)   SpO2 98%   BMI 34.37 kg/m    Physical Exam Constitutional:      Appearance: Normal appearance.  HENT:     Head: Normocephalic and atraumatic.     Comments: Hearing aids in place.    Nose: Nose normal.     Mouth/Throat:     Mouth: Mucous membranes are moist.     Pharynx: No oropharyngeal exudate or posterior oropharyngeal erythema.  Eyes:     General: No scleral icterus.    Extraocular Movements: Extraocular movements intact.  Neck:     Thyroid: No thyromegaly.  Cardiovascular:     Rate and Rhythm: Normal rate and regular rhythm.     Pulses: Normal pulses.     Heart sounds: Normal heart sounds. No murmur heard.    No friction rub.  Pulmonary:     Effort: Pulmonary effort is normal.     Breath sounds: Normal breath sounds. No wheezing, rhonchi or rales.  Abdominal:     General: Bowel sounds are normal.     Palpations: Abdomen is soft.     Tenderness: There is no abdominal tenderness.  Musculoskeletal:        General:  No deformity. Normal range of motion.  Lymphadenopathy:     Cervical: No cervical adenopathy.  Skin:    General: Skin is warm and dry.     Findings: No lesion.  Neurological:     General: No focal deficit present.     Mental Status: She is alert and oriented to person, place, and time.      No results found for any visits on 09/19/22.    Assessment & Plan:  Mixed hyperlipidemia -Continue Lovaza.  Patient currently taking 1 capsule 4 times daily.  Standard dosing is 2 capsules twice daily. -Obtain lipid panel this visit.  Patient is not fasting. -     Comprehensive metabolic panel -     Lipid panel -     TSH -     Omega-3-acid Ethyl Esters; Take 1 capsule (1 g total) by mouth 4 (four) times daily.  Dispense: 360 capsule; Refill: 3  Central centrifugal scarring alopecia -     CBC with Differential/Platelet  Class 1  obesity with serious comorbidity and body mass index (BMI) of 34.0 to 34.9 in adult, unspecified obesity type -Body mass index is 34.37 kg/m. -Modifications encouraged including decreasing portions and increasing physical activity. -     VITAMIN D 25 Hydroxy (Vit-D Deficiency, Fractures)  Sensorineural hearing loss (SNHL) of both ears -Continue wearing hearing aids -Continue follow-up with audiology   Return in about 5 months (around 02/19/2023) for chronic conditions.   Deeann Saint, MD

## 2022-10-09 ENCOUNTER — Telehealth: Payer: Self-pay | Admitting: Family Medicine

## 2022-10-09 ENCOUNTER — Other Ambulatory Visit: Payer: Self-pay

## 2022-10-09 NOTE — Telephone Encounter (Signed)
Spoke to pt and went over lab result. Copy of lab result is dropped to be mail out.

## 2022-10-09 NOTE — Telephone Encounter (Signed)
Pt is calling and would like a callback again for someone to repeat the blood work results from 09-19-2022 and also mail copy to home address

## 2022-11-13 DIAGNOSIS — H04123 Dry eye syndrome of bilateral lacrimal glands: Secondary | ICD-10-CM | POA: Diagnosis not present

## 2022-11-13 DIAGNOSIS — H43813 Vitreous degeneration, bilateral: Secondary | ICD-10-CM | POA: Diagnosis not present

## 2023-02-14 DIAGNOSIS — H04123 Dry eye syndrome of bilateral lacrimal glands: Secondary | ICD-10-CM | POA: Diagnosis not present

## 2023-02-14 DIAGNOSIS — H43813 Vitreous degeneration, bilateral: Secondary | ICD-10-CM | POA: Diagnosis not present

## 2023-03-20 ENCOUNTER — Encounter: Payer: Self-pay | Admitting: Family Medicine

## 2023-03-20 ENCOUNTER — Ambulatory Visit (INDEPENDENT_AMBULATORY_CARE_PROVIDER_SITE_OTHER): Payer: Medicare Other | Admitting: Family Medicine

## 2023-03-20 VITALS — BP 136/74 | HR 74 | Temp 98.3°F | Ht 63.0 in | Wt 193.2 lb

## 2023-03-20 DIAGNOSIS — E782 Mixed hyperlipidemia: Secondary | ICD-10-CM

## 2023-03-20 DIAGNOSIS — R600 Localized edema: Secondary | ICD-10-CM

## 2023-03-20 DIAGNOSIS — L853 Xerosis cutis: Secondary | ICD-10-CM | POA: Diagnosis not present

## 2023-03-20 DIAGNOSIS — H04203 Unspecified epiphora, bilateral lacrimal glands: Secondary | ICD-10-CM | POA: Diagnosis not present

## 2023-03-20 DIAGNOSIS — E66811 Obesity, class 1: Secondary | ICD-10-CM

## 2023-03-20 DIAGNOSIS — R439 Unspecified disturbances of smell and taste: Secondary | ICD-10-CM

## 2023-03-20 LAB — TSH: TSH: 4.03 u[IU]/mL (ref 0.35–5.50)

## 2023-03-20 LAB — CBC WITH DIFFERENTIAL/PLATELET
Basophils Absolute: 0.1 10*3/uL (ref 0.0–0.1)
Basophils Relative: 1.3 % (ref 0.0–3.0)
Eosinophils Absolute: 0.1 10*3/uL (ref 0.0–0.7)
Eosinophils Relative: 2.3 % (ref 0.0–5.0)
HCT: 43.3 % (ref 36.0–46.0)
Hemoglobin: 14.1 g/dL (ref 12.0–15.0)
Lymphocytes Relative: 33.3 % (ref 12.0–46.0)
Lymphs Abs: 1.3 10*3/uL (ref 0.7–4.0)
MCHC: 32.6 g/dL (ref 30.0–36.0)
MCV: 89 fL (ref 78.0–100.0)
Monocytes Absolute: 0.4 10*3/uL (ref 0.1–1.0)
Monocytes Relative: 9.1 % (ref 3.0–12.0)
Neutro Abs: 2.1 10*3/uL (ref 1.4–7.7)
Neutrophils Relative %: 54 % (ref 43.0–77.0)
Platelets: 210 10*3/uL (ref 150.0–400.0)
RBC: 4.87 Mil/uL (ref 3.87–5.11)
RDW: 15.5 % (ref 11.5–15.5)
WBC: 4 10*3/uL (ref 4.0–10.5)

## 2023-03-20 LAB — COMPREHENSIVE METABOLIC PANEL
ALT: 15 U/L (ref 0–35)
AST: 19 U/L (ref 0–37)
Albumin: 4.5 g/dL (ref 3.5–5.2)
Alkaline Phosphatase: 95 U/L (ref 39–117)
BUN: 15 mg/dL (ref 6–23)
CO2: 29 meq/L (ref 19–32)
Calcium: 10 mg/dL (ref 8.4–10.5)
Chloride: 103 meq/L (ref 96–112)
Creatinine, Ser: 1.19 mg/dL (ref 0.40–1.20)
GFR: 42.04 mL/min — ABNORMAL LOW (ref >60.00)
Glucose, Bld: 114 mg/dL — ABNORMAL HIGH (ref 70–99)
Potassium: 4 meq/L (ref 3.5–5.1)
Sodium: 139 meq/L (ref 135–145)
Total Bilirubin: 0.5 mg/dL (ref 0.2–1.2)
Total Protein: 7.6 g/dL (ref 6.0–8.3)

## 2023-03-20 LAB — T4, FREE: Free T4: 0.88 ng/dL (ref 0.60–1.60)

## 2023-03-20 NOTE — Progress Notes (Addendum)
Established Patient Office Visit   Subjective  Patient ID: [name], female    DOB: 1940/05/11  Age: 82 y.o. MRN: [id number]  Chief Complaint  Patient presents with   Medical Management of Chronic Issues    Pt an 82 year old female seen today for follow-up on ongoing concerns.  Patient endorses bilateral puffiness under her eyes.  Puffiness resolves in AM.  Seen by ophthalmology given Restasis eyedrops for dry eye.  Advised puffiness may be from drinking 8 glasses of water daily.  Patient denies LE edema or other edema.  Has not noticed improvement with new eyedrop.  Patient also notes intermittent difficulty smelling and tasting.  States some foods do not smell or taste like they should.  States that orange did not taste the same but other fruit did that same day.  Also notes being unable to smell fish some days when cooking.  Patient denies history of allergies.  Patient notes some improvement in dry skin, but still does not look how she wanted to.  Using Nivea lotion.  Taking shorter less hot showers and applying lotion immediately after showering.  Patient does mention being told she had hard water in the past.    Patient Active Problem List   Diagnosis Date Noted   Mixed hyperlipidemia 09/19/2022   Central centrifugal scarring alopecia 09/19/2022   Class 1 obesity with serious comorbidity and body mass index (BMI) of 34.0 to 34.9 in adult 09/19/2022   Sensorineural hearing loss (SNHL) of both ears 09/19/2022   Syncope 02/20/2018   Hyperglycemia 02/20/2018   Mild renal insufficiency 02/20/2018   Past Medical History:  Diagnosis Date   Chronic kidney disease    Past Surgical History:  Procedure Laterality Date   CAPSULOTOMY Right 07/25/2012   Procedure: MINOR PROCEDURE YAG LASER CAPSULOTOMY;  Surgeon: Vita Erm., MD;  Location: [facility] OR;  Service: Ophthalmology;  Laterality: Right;   CATARACT EXTRACTION W/PHACO Left 04/27/2015   Procedure: CATARACT EXTRACTION  PHACO AND INTRAOCULAR LENS PLACEMENT (IOC) LEFT EYE;  Surgeon: Chalmers Guest, MD;  Location: [facility] OR;  Service: Ophthalmology;  Laterality: Left;   COLONOSCOPY     EYE SURGERY Right    Caract   Social History   Tobacco Use   Smoking status: Former    Types: Cigarettes   Tobacco comments:    quit in 1995  Substance Use Topics   Alcohol use: No   Drug use: No   Family History  Problem Relation Age of Onset   Obesity Daughter    No Known Allergies    ROS Negative unless stated above    Objective:     BP 136/74 (BP Location: Left Arm, Patient Position: Sitting, Cuff Size: Normal)   Pulse 74   Temp 98.3 F (36.8 C) (Oral)   Ht 5\' 3"  (1.6 m)   Wt 193 lb 3.2 oz (87.6 kg)   SpO2 97%   BMI 34.22 kg/m  BP Readings from Last 3 Encounters:  03/20/23 136/74  09/19/22 138/76  07/23/22 124/66   Wt Readings from Last 3 Encounters:  03/20/23 193 lb 3.2 oz (87.6 kg)  09/19/22 194 lb (88 kg)  08/10/22 193 lb (87.5 kg)      Physical Exam Constitutional:      General: She is not in acute distress.    Appearance: Normal appearance.  HENT:     Head: Normocephalic and atraumatic.     Comments: Patient able to smell ammonia packet in clinic.  Ears:     Comments: Hearing aids in place.  Canals without cerumen.  Right TM normal.  Left TM flat and dull in color.    Nose: Nose normal.     Right Sinus: No maxillary sinus tenderness or frontal sinus tenderness.     Left Sinus: No maxillary sinus tenderness or frontal sinus tenderness.     Mouth/Throat:     Mouth: Mucous membranes are moist.  Eyes:     Extraocular Movements: Extraocular movements intact.     Conjunctiva/sclera: Conjunctivae normal.  Cardiovascular:     Rate and Rhythm: Normal rate and regular rhythm.     Heart sounds: Normal heart sounds. No murmur heard.    No gallop.  Pulmonary:     Effort: Pulmonary effort is normal. No respiratory distress.     Breath sounds: Normal breath sounds. No wheezing, rhonchi or  rales.  Skin:    General: Skin is warm and dry.  Neurological:     Mental Status: She is alert and oriented to person, place, and time.      No results found for any visits on 03/20/23.    Assessment & Plan:  Watery eyes  Dry skin -     TSH -     Comprehensive metabolic panel  Facial edema -     TSH -     Comprehensive metabolic panel -     CBC with Differential/Platelet -     T4, free  Decreased taste and smell -     CBC with Differential/Platelet  Patient with watery eyes, dry skin, facial edema and change in taste and smell.  Symptoms may be related to seasonal allergies.  Patient advised to try half tab Allegra or other OTC allergy medication.  Given samples of Zyrtec in clinic.  Advised to contact ophthalmology if Restasis eyedrops are not helping symptoms.  Patient also advised to cut down on water intake to see if she notices a difference in facial edema.  Consider looking into chelators for hard water.  Return in about 7 weeks (around 05/08/2023), or if symptoms worsen or fail to improve.   Deeann Saint, MD

## 2023-03-20 NOTE — Patient Instructions (Signed)
You can try over-the-counter allergy medicine such as half a tab of Allegra, Claritin, Zyrtec, or Xyzal to see if he noticed improvement in your eyes, smell, and taste.  It allergies are the cause of your symptoms you should notice improvement after taking a few doses of the medicine.  Symptoms you can try an allergy pill and it just does not work well for you so you may have to try one of the other medicines.  Orders for labs were placed.

## 2023-03-27 ENCOUNTER — Ambulatory Visit
Admission: RE | Admit: 2023-03-27 | Discharge: 2023-03-27 | Disposition: A | Discharge status: Home / Self Care | Payer: Medicare Other | Source: Ambulatory Visit | Attending: Family Medicine

## 2023-03-27 DIAGNOSIS — M8588 Other specified disorders of bone density and structure, other site: Secondary | ICD-10-CM | POA: Diagnosis not present

## 2023-03-27 DIAGNOSIS — N958 Other specified menopausal and perimenopausal disorders: Secondary | ICD-10-CM | POA: Diagnosis not present

## 2023-03-27 DIAGNOSIS — E2839 Other primary ovarian failure: Secondary | ICD-10-CM | POA: Diagnosis not present

## 2023-03-27 DIAGNOSIS — Z13820 Encounter for screening for osteoporosis: Secondary | ICD-10-CM

## 2023-04-08 ENCOUNTER — Telehealth: Payer: Self-pay | Admitting: Family Medicine

## 2023-04-08 NOTE — Telephone Encounter (Signed)
Pt returned call for results.

## 2023-04-08 NOTE — Telephone Encounter (Signed)
Called and spoke with patient, putting results in the mail per patient request

## 2023-04-10 ENCOUNTER — Encounter: Payer: Self-pay | Admitting: Family Medicine

## 2023-04-10 ENCOUNTER — Ambulatory Visit: Payer: Medicare Other | Admitting: Family Medicine

## 2023-04-10 VITALS — BP 136/72 | HR 74 | Temp 98.7°F | Ht 63.0 in | Wt 193.2 lb

## 2023-04-10 DIAGNOSIS — N1832 Chronic kidney disease, stage 3b: Secondary | ICD-10-CM

## 2023-04-10 NOTE — Progress Notes (Signed)
Established Patient Office Visit   Subjective  Patient ID: [name], female    DOB: [birth year]  Age: 82 y.o. MRN: [id number]  No chief complaint on file. Pt accompanied by her eldest daughter.  Patient is an 82 year old female seen for review of labs.  Voice mail left regarding patient's labs.  Chronic kidney disease noted.  Patient's daughter upset as she nor pt were previously aware of this.  Advised patient recently establish care with this provider March 2024, but CKD previously mentioned in chart by prior PCP.  Patient inquires if she can take medication or make other changes to help.  Patient mentions being given Gemtesa for frequent urination and inquires if this was for kidney disease.    Patient Active Problem List   Diagnosis Date Noted   Mixed hyperlipidemia 09/19/2022   Central centrifugal scarring alopecia 09/19/2022   Class 1 obesity with serious comorbidity and body mass index (BMI) of 34.0 to 34.9 in adult 09/19/2022   Sensorineural hearing loss (SNHL) of both ears 09/19/2022   Syncope 02/20/2018   Hyperglycemia 02/20/2018   Mild renal insufficiency 02/20/2018   Past Medical History:  Diagnosis Date   Chronic kidney disease    Past Surgical History:  Procedure Laterality Date   CAPSULOTOMY Right 07/25/2012   Procedure: MINOR PROCEDURE YAG LASER CAPSULOTOMY;  Surgeon: Vita Erm., MD;  Location: [facility] OR;  Service: Ophthalmology;  Laterality: Right;   CATARACT EXTRACTION W/PHACO Left 04/27/2015   Procedure: CATARACT EXTRACTION PHACO AND INTRAOCULAR LENS PLACEMENT (IOC) LEFT EYE;  Surgeon: Chalmers Guest, MD;  Location: [facility] OR;  Service: Ophthalmology;  Laterality: Left;   COLONOSCOPY     EYE SURGERY Right    Caract   Social History   Tobacco Use   Smoking status: Former    Types: Cigarettes   Tobacco comments:    quit in 1995  Substance Use Topics   Alcohol use: No   Drug use: No   Family History  Problem Relation Age of Onset   Obesity  Daughter    No Known Allergies    ROS Negative unless stated above    Objective:     BP 136/72 (BP Location: Left Arm, Patient Position: Sitting, Cuff Size: Large)   Pulse 74   Temp 98.7 F (37.1 C) (Oral)   Ht 5\' 3"  (1.6 m)   Wt 193 lb 3.2 oz (87.6 kg)   SpO2 97%   BMI 34.22 kg/m  BP Readings from Last 3 Encounters:  04/10/23 136/72  03/20/23 136/74  09/19/22 138/76   Wt Readings from Last 3 Encounters:  04/10/23 193 lb 3.2 oz (87.6 kg)  03/20/23 193 lb 3.2 oz (87.6 kg)  09/19/22 194 lb (88 kg)      Physical Exam Constitutional:      Appearance: Normal appearance.  HENT:     Head: Normocephalic and atraumatic.     Mouth/Throat:     Mouth: Mucous membranes are moist.  Eyes:     Extraocular Movements: Extraocular movements intact.  Pulmonary:     Effort: Pulmonary effort is normal.  Skin:    General: Skin is warm and dry.  Neurological:     Mental Status: She is alert and oriented to person, place, and time.      No results found for any visits on 04/10/23.    Assessment & Plan:  Chronic kidney disease, stage 3b (HCC)  Discussed natural decline of renal function with age.  GFR 42 on 03/20/2023.  Per chart review chronic kidney disease noted in 2016 by previous PCP.  For further decrease in GFR refer to nephrology.  Avoid NSAIDs and nephrotoxic medications.  Renally dose medications.  Questions answered.  Continue to monitor.    Return in about 3 months (around 07/09/2023), or if symptoms worsen or fail to improve.   Deeann Saint, MD

## 2023-04-11 ENCOUNTER — Telehealth: Payer: Self-pay

## 2023-04-11 NOTE — Telephone Encounter (Signed)
Patient and daughter came in to the office to talk about labs, patient's daughter was up-set with how the labs results where given over the phone and she stated that the kidney issues were new and that it should have been explained more, I explained to patient and the daughter that there was a  DPR signed and that gives permission to leave a Detailed message on the VM. Spoke with the Dr.Banks so she is aware. Patient received labs paperwork in office labs were not mailed

## 2023-05-20 ENCOUNTER — Ambulatory Visit: Payer: PRIVATE HEALTH INSURANCE | Admitting: Family Medicine

## 2023-05-22 ENCOUNTER — Ambulatory Visit: Payer: PRIVATE HEALTH INSURANCE | Admitting: Family Medicine

## 2023-07-12 ENCOUNTER — Ambulatory Visit (INDEPENDENT_AMBULATORY_CARE_PROVIDER_SITE_OTHER): Payer: Medicare Other | Admitting: Family Medicine

## 2023-07-12 ENCOUNTER — Encounter: Payer: Self-pay | Admitting: Family Medicine

## 2023-07-12 VITALS — BP 149/73 | HR 67 | Temp 97.7°F | Ht 63.0 in | Wt 194.8 lb

## 2023-07-12 DIAGNOSIS — N1832 Chronic kidney disease, stage 3b: Secondary | ICD-10-CM | POA: Diagnosis not present

## 2023-07-12 DIAGNOSIS — R030 Elevated blood-pressure reading, without diagnosis of hypertension: Secondary | ICD-10-CM

## 2023-07-12 DIAGNOSIS — J302 Other seasonal allergic rhinitis: Secondary | ICD-10-CM

## 2023-07-12 NOTE — Patient Instructions (Signed)
 You can start checking your blood pressure a few times per week and keeping a log of the readings to bring with you to clinic.  Ideally we would like her blood pressure to be less than 140/90 consistently.  Will have you follow-up in the next few weeks to review your blood pressure.  You can bring your meter in your log with you at that visit.

## 2023-07-12 NOTE — Progress Notes (Signed)
 Established Patient Office Visit   Subjective  Patient ID: [name], female    DOB: [birth year]  Age: 83 y.o. MRN: [id number]  Chief Complaint  Patient presents with   Medical Management of Chronic Issues    3 month follow up     Patient is an 83 year old female seen for follow-up.  Patient informed of CKD stage IIIb diagnosis at last office visit.  Per record review this was an ongoing diagnosis but patient was unaware.  Since last visit patient has been working to decrease intake of potassium and phosphorus.  Frustrated as feels like she has limited food options.  Would like to see a nutritionist.  Patient unsure why BP elevated this morning.  No formal Dx of HTN.  Has BP monitors at home however stopped checking BP as cuff was way different from previous providers readings in office.  Patient states she was later told that the office automated machine was incorrect.  Patient states she may have been anxious about getting to appointment on time as she was waiting for her daughter to pick her up who is coming from [place].  Patient denies overt anxiety.  Patient mentions increased nasal congestion after going outside working in the yard and with walking to [employer].  Symptoms improved some with DayQuil.  Also noted right-sided low back pain after bending over in the yard picking up leaves/debris.  Symptoms resolved with topical cream.    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   Class 1 obesity with serious comorbidity and body mass index (BMI) of 34.0 to 34.9 in adult 09/19/2022   Sensorineural hearing loss (SNHL) of both ears 09/19/2022   Syncope 02/20/2018   Hyperglycemia 02/20/2018   Mild renal insufficiency 02/20/2018   Past Medical History:  Diagnosis Date   Chronic kidney disease    Past Surgical History:   Procedure Laterality Date   CAPSULOTOMY Right 07/25/2012   Procedure: MINOR PROCEDURE YAG LASER CAPSULOTOMY;  Surgeon: Vita Erm., MD;  Location: [facility] OR;  Service: Ophthalmology;  Laterality: Right;   CATARACT EXTRACTION W/PHACO Left 04/27/2015   Procedure: CATARACT EXTRACTION PHACO AND INTRAOCULAR LENS PLACEMENT (IOC) LEFT EYE;  Surgeon: Chalmers Guest, MD;  Location: [facility] OR;  Service: Ophthalmology;  Laterality: Left;   COLONOSCOPY     EYE SURGERY Right    Caract   Social History   Tobacco Use   Smoking status: Former    Types: Cigarettes   Tobacco comments:    quit in 1995  Substance Use Topics   Alcohol use: No   Drug use: No   Family History  Problem Relation Age of Onset   Obesity Daughter    No Known Allergies    ROS Negative unless stated above    Objective:     BP (!) 149/73 (BP Location: Left Arm, Patient Position: Sitting, Cuff Size: Large)   Pulse 67   Temp 97.7 F (36.5 C) (Oral)   Ht 5\' 3"  (1.6 m)   Wt 194 lb 12.8 oz (88.4 kg)   SpO2 97%   BMI 34.51 kg/m  BP Readings from Last 3 Encounters:  07/12/23 (!) 149/73  04/10/23 136/72  03/20/23 136/74   Wt Readings from Last 3 Encounters:  07/12/23 194 lb 12.8 oz (88.4 kg)  04/10/23 193 lb 3.2 oz (87.6 kg)  03/20/23 193  lb 3.2 oz (87.6 kg)     Physical Exam Constitutional:      General: She is not in acute distress.    Appearance: Normal appearance.  HENT:     Head: Normocephalic and atraumatic.     Nose: Nose normal.     Mouth/Throat:     Mouth: Mucous membranes are moist.  Cardiovascular:     Rate and Rhythm: Normal rate and regular rhythm.     Heart sounds: Normal heart sounds. No murmur heard.    No gallop.  Pulmonary:     Effort: Pulmonary effort is normal. No respiratory distress.     Breath sounds: Normal breath sounds. No wheezing, rhonchi or rales.  Skin:    General: Skin is warm and dry.  Neurological:     Mental Status: She is alert and oriented to person, place, and  time.       07/12/2023   11:47 AM 04/10/2023    4:48 PM 03/20/2023    1:18 PM  GAD 7 : Generalized Anxiety Score  Nervous, Anxious, on Edge 0 0 1  Control/stop worrying 0 1 0  Worry too much - different things 2 1 0  Trouble relaxing 0  0  Restless 0 0 0  Easily annoyed or irritable 0 0 0  Afraid - awful might happen 0 1 1  Total GAD 7 Score 2  2  Anxiety Difficulty  Not difficult at all        07/12/2023   11:47 AM 04/10/2023    4:47 PM 03/20/2023    1:17 PM  Depression screen PHQ 2/9  Decreased Interest 0  0  Down, Depressed, Hopeless 0 0 0  PHQ - 2 Score 0 0 0  Altered sleeping 1 1 1   Tired, decreased energy 1 0 1  Change in appetite 0 0 0  Feeling bad or failure about yourself  0 0 0  Trouble concentrating 0 1 0  Moving slowly or fidgety/restless 0 0 0  Suicidal thoughts 0 0 0  PHQ-9 Score 2 2 2   Difficult doing work/chores  Not difficult at all Not difficult at all   No results found for any visits on 07/12/23.    Assessment & Plan:  Chronic kidney disease, stage 3b (HCC) -     Amb Referral to Nutrition and Diabetic Education  Elevated blood pressure reading in office without diagnosis of hypertension -     Amb Referral to Nutrition and Diabetic Education  Seasonal allergies  Patient seen for follow-up.  CKD stage IIIb stable.  eGFR 42 on 03/20/2023.  Avoid nephrotoxic meds and renally dose medications.  Referral to nutrition placed as requested per patient.  Discussed referral to nephrology in the near future for monitoring.  Wishes to wait at this time.  Okay to continue half a tab of OTC Xyzal or other antihistamine as needed for allergy symptoms.  Will have patient monitor BP at home and keep a log to bring with her to clinic.  For readings consistently greater than 140/90 start medication.  Return in about 4 weeks (around 08/09/2023) for blood pressure.   Deeann Saint, MD

## 2023-08-14 ENCOUNTER — Ambulatory Visit (INDEPENDENT_AMBULATORY_CARE_PROVIDER_SITE_OTHER): Admitting: Family Medicine

## 2023-08-14 ENCOUNTER — Encounter: Payer: Self-pay | Admitting: Family Medicine

## 2023-08-14 VITALS — BP 138/69 | HR 66 | Temp 98.5°F | Ht 63.0 in | Wt 193.4 lb

## 2023-08-14 DIAGNOSIS — R030 Elevated blood-pressure reading, without diagnosis of hypertension: Secondary | ICD-10-CM | POA: Diagnosis not present

## 2023-08-14 DIAGNOSIS — N1832 Chronic kidney disease, stage 3b: Secondary | ICD-10-CM

## 2023-08-14 DIAGNOSIS — F419 Anxiety disorder, unspecified: Secondary | ICD-10-CM | POA: Diagnosis not present

## 2023-08-14 NOTE — Patient Instructions (Signed)
 You can reschedule the annual wellness visit and make it a phone call.  Just ask the front desk.  You can also check with them regarding any bills.

## 2023-08-14 NOTE — Progress Notes (Signed)
 Established Patient Office Visit   Subjective  Patient ID: [name], female    DOB: Jul 11, 1940  Age: 83 y.o. MRN: [id number]  Chief Complaint  Patient presents with   Follow-up    Blood pressure  Pt accompanied by her daughter.  Patient is an 83 year old female seen for follow-up on chronic conditions.  Pt monitoring BP at home.  Both home BP cuffs to appointment.  One cuff systolic reading is 20 pts higher than manual reading.  Majority of bp readings somewhat controlled.  Pt's daughter mentions pt's anxiety.  Pt has increased GI distress and unable to sleep prior to doctors appointments.  Pt states she has "always worried if that's what you mean by anxiety".   Pt's daughter inquires about medication options.    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   Class 1 obesity with serious comorbidity and body mass index (BMI) of 34.0 to 34.9 in adult 09/19/2022   Sensorineural hearing loss (SNHL) of both ears 09/19/2022   Syncope 02/20/2018   Hyperglycemia 02/20/2018   Mild renal insufficiency 02/20/2018   Past Medical History:  Diagnosis Date   Chronic kidney disease    Past Surgical History:  Procedure Laterality Date   CAPSULOTOMY Right 07/25/2012   Procedure: MINOR PROCEDURE YAG LASER CAPSULOTOMY;  Surgeon: Russel Courser., MD;  Location: [facility] OR;  Service: Ophthalmology;  Laterality: Right;   CATARACT EXTRACTION W/PHACO Left 04/27/2015   Procedure: CATARACT EXTRACTION PHACO AND INTRAOCULAR LENS PLACEMENT (IOC) LEFT EYE;  Surgeon: Ben Bracken, MD;  Location: [facility] OR;  Service: Ophthalmology;  Laterality: Left;   COLONOSCOPY     EYE SURGERY Right    Caract   Social History   Tobacco Use   Smoking status: Former    Types: Cigarettes   Tobacco comments:    quit in 1995  Substance Use Topics   Alcohol use: No    Drug use: No   Family History  Problem Relation Age of Onset   Obesity Daughter    No Known Allergies    ROS Negative unless stated above    Objective:     BP 138/69 (BP Location: Left Arm, Patient Position: Sitting, Cuff Size: Normal)   Pulse 66   Temp 98.5 F (36.9 C) (Oral)   Ht 5\' 3"  (1.6 m)   Wt 193 lb 6.4 oz (87.7 kg)   SpO2 98%   BMI 34.26 kg/m  BP Readings from Last 3 Encounters:  08/14/23 138/69  07/12/23 (!) 149/73  04/10/23 136/72   Wt Readings from Last 3 Encounters:  08/14/23 193 lb 6.4 oz (87.7 kg)  07/12/23 194 lb 12.8 oz (88.4 kg)  04/10/23 193 lb 3.2 oz (87.6 kg)    Physical Exam Constitutional:      General: She is not in acute distress.    Appearance: Normal appearance.  HENT:     Head: Normocephalic and atraumatic.     Nose: Nose normal.     Mouth/Throat:     Mouth: Mucous membranes are moist.  Cardiovascular:     Rate and Rhythm: Normal rate and regular rhythm.     Heart sounds: Normal heart sounds. No murmur heard.    No gallop.  Pulmonary:     Effort: Pulmonary effort is normal. No respiratory distress.     Breath sounds: Normal breath sounds. No wheezing,  rhonchi or rales.  Skin:    General: Skin is warm and dry.  Neurological:     Mental Status: She is alert and oriented to person, place, and time.    No results found for any visits on 08/14/23.    Assessment & Plan:  White coat syndrome without diagnosis of hypertension  Chronic kidney disease, stage 3b (HCC)  Anxiety  Pt with white coat HTN.  BP initially elevated.  Recheck 138/69 by this provider.  Readings at home fairly well controlled.  For elevations consistently greater than 140/90 start medication.  Lifestyle modifications encouraged.  MoCA at next OFV.  eGFR 42 and creatinine 1.190 on 03/20/2023.  Patient encouraged to consider counseling and/or medication options for anxiety.  Declines at this time.  Continue to monitor.  Return in about 3 months (around  11/13/2023).   Viola Greulich, MD

## 2023-08-22 ENCOUNTER — Ambulatory Visit: Admitting: Skilled Nursing Facility1

## 2023-08-27 ENCOUNTER — Other Ambulatory Visit: Payer: Self-pay | Admitting: Family Medicine

## 2023-08-27 DIAGNOSIS — E782 Mixed hyperlipidemia: Secondary | ICD-10-CM

## 2023-11-07 ENCOUNTER — Other Ambulatory Visit: Payer: Self-pay | Admitting: Family Medicine

## 2023-11-07 DIAGNOSIS — Z1231 Encounter for screening mammogram for malignant neoplasm of breast: Secondary | ICD-10-CM

## 2023-11-18 ENCOUNTER — Encounter: Payer: Self-pay | Admitting: Family Medicine

## 2023-11-18 ENCOUNTER — Ambulatory Visit (INDEPENDENT_AMBULATORY_CARE_PROVIDER_SITE_OTHER): Admitting: Family Medicine

## 2023-11-18 VITALS — BP 135/73 | HR 60 | Temp 97.7°F | Ht 63.0 in | Wt 190.0 lb

## 2023-11-18 DIAGNOSIS — F419 Anxiety disorder, unspecified: Secondary | ICD-10-CM | POA: Diagnosis not present

## 2023-11-18 DIAGNOSIS — E7841 Elevated Lipoprotein(a): Secondary | ICD-10-CM | POA: Diagnosis not present

## 2023-11-18 DIAGNOSIS — R030 Elevated blood-pressure reading, without diagnosis of hypertension: Secondary | ICD-10-CM

## 2023-11-18 DIAGNOSIS — N1832 Chronic kidney disease, stage 3b: Secondary | ICD-10-CM | POA: Diagnosis not present

## 2023-11-18 LAB — BASIC METABOLIC PANEL WITH GFR
BUN: 11 mg/dL (ref 6–23)
CO2: 27 meq/L (ref 19–32)
Calcium: 10.3 mg/dL (ref 8.4–10.5)
Chloride: 104 meq/L (ref 96–112)
Creatinine, Ser: 1.01 mg/dL (ref 0.40–1.20)
GFR: 50.95 mL/min — ABNORMAL LOW (ref >60.00)
Glucose, Bld: 105 mg/dL — ABNORMAL HIGH (ref 70–99)
Potassium: 4 meq/L (ref 3.5–5.1)
Sodium: 140 meq/L (ref 135–145)

## 2023-11-18 LAB — LIPID PANEL
Cholesterol: 179 mg/dL (ref 0–200)
HDL: 55.3 mg/dL (ref >39.00)
LDL Cholesterol: 106 mg/dL — ABNORMAL HIGH (ref 0–99)
NonHDL: 123.78
Total CHOL/HDL Ratio: 3
Triglycerides: 91 mg/dL (ref 0.0–149.0)
VLDL: 18.2 mg/dL (ref 0.0–40.0)

## 2023-11-18 NOTE — Progress Notes (Signed)
 Established Patient Office Visit   Subjective  Patient ID: [name], female    DOB: Sep 25, 1940  Age: 83 y.o. MRN: [id number]  Chief Complaint  Patient presents with   Medical Management of Chronic Issues    Pt is here with daughter, [name], for 3 months f/u.   Patient accompanied by her daughter, [name].  Pt is an 83 year old female seen for follow-up on chronic conditions.  States she has been doing wel.  Getting reliable BP readings at home, 1 teens-160/60s-80.  States systolic BP dropped to 80 during dental visit.  Does not recall bottom number.  States was not rechecked at time of visit.  Patient denies dizziness, headache, change in vision, CP, LE edema.  Patient still gets nervous when having to drive places.  Tries to schedule appointments at times where her family members or friends can take her.  Did drive to dental appointment on her own.  Had to reschedule nutrition appointment due to transportation issues as her friend that was going to take her had COVID.  Patient states the lady on the phone was less than pleasant and appointment was moved from April to September 19th.  Patient inquires if there is anything she should be taking to help with improving renal function.    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   Class 1 obesity with serious comorbidity and body mass index (BMI) of 34.0 to 34.9 in adult 09/19/2022   Sensorineural hearing loss (SNHL) of both ears 09/19/2022   Syncope 02/20/2018   Hyperglycemia 02/20/2018   Mild renal insufficiency 02/20/2018   Past Medical History:  Diagnosis Date   Chronic kidney disease    Past Surgical History:  Procedure Laterality Date   CAPSULOTOMY Right 07/25/2012   Procedure: MINOR PROCEDURE YAG LASER CAPSULOTOMY;  Surgeon: Debby FORBES Sharalyn Mickey., MD;  Location: [facility] OR;   Service: Ophthalmology;  Laterality: Right;   CATARACT EXTRACTION W/PHACO Left 04/27/2015   Procedure: CATARACT EXTRACTION PHACO AND INTRAOCULAR LENS PLACEMENT (IOC) LEFT EYE;  Surgeon: Gaither Quan, MD;  Location: [facility] OR;  Service: Ophthalmology;  Laterality: Left;   COLONOSCOPY     EYE SURGERY Right    Caract   Social History   Tobacco Use   Smoking status: Former    Types: Cigarettes   Tobacco comments:    quit in 1995  Substance Use Topics   Alcohol use: No   Drug use: No   Family History  Problem Relation Age of Onset   Obesity Daughter    No Known Allergies  ROS Negative unless stated above    Objective:     BP 135/73 (BP Location: Left Arm, Patient Position: Sitting, Cuff Size: Normal)   Pulse 60   Temp 97.7 F (36.5 C) (Oral)   Ht 5' 3 (1.6 m)   Wt 190 lb (86.2 kg)   SpO2 97%   BMI 33.66 kg/m  BP Readings from Last 3 Encounters:  11/18/23 135/73  08/14/23 138/69  07/12/23 (!) 149/73   Wt Readings from Last 3 Encounters:  11/18/23 190 lb (86.2 kg)  08/14/23 193 lb 6.4 oz (87.7 kg)  07/12/23 194 lb 12.8 oz (88.4 kg)      Physical Exam Constitutional:      General: She is not in acute distress.    Appearance: Normal appearance.  HENT:  Head: Normocephalic and atraumatic.     Nose: Nose normal.     Mouth/Throat:     Mouth: Mucous membranes are moist.  Cardiovascular:     Rate and Rhythm: Normal rate and regular rhythm.     Heart sounds: Normal heart sounds. No murmur heard.    No gallop.  Pulmonary:     Effort: Pulmonary effort is normal. No respiratory distress.     Breath sounds: Normal breath sounds. No wheezing, rhonchi or rales.  Skin:    General: Skin is warm and dry.  Neurological:     Mental Status: She is alert and oriented to person, place, and time.        07/12/2023   11:47 AM 04/10/2023    4:47 PM 03/20/2023    1:17 PM  Depression screen PHQ 2/9  Decreased Interest 0  0  Down, Depressed, Hopeless 0 0 0  PHQ - 2  Score 0 0 0  Altered sleeping 1 1 1   Tired, decreased energy 1 0 1  Change in appetite 0 0 0  Feeling bad or failure about yourself  0 0 0  Trouble concentrating 0 1 0  Moving slowly or fidgety/restless 0 0 0  Suicidal thoughts 0 0 0  PHQ-9 Score 2 2 2   Difficult doing work/chores  Not difficult at all Not difficult at all      07/12/2023   11:47 AM 04/10/2023    4:48 PM 03/20/2023    1:18 PM  GAD 7 : Generalized Anxiety Score  Nervous, Anxious, on Edge 0 0 1  Control/stop worrying 0 1 0  Worry too much - different things 2 1 0  Trouble relaxing 0  0  Restless 0 0 0  Easily annoyed or irritable 0 0 0  Afraid - awful might happen 0 1 1  Total GAD 7 Score 2  2  Anxiety Difficulty  Not difficult at all      No results found for any visits on 11/18/23.    Assessment & Plan:   White coat syndrome without diagnosis of hypertension  Chronic kidney disease, stage 3b (HCC) -     Basic metabolic panel with GFR; Future  Anxiety  Elevated lipoprotein(a) -     Lipid panel; Future  BP liable at home.  Stable in clinic.  Continue lifestyle modifications including monitoring sodium intake.  Not currently on BP meds.  Keep upcoming appointment with nutrition.  Will recheck renal function this visit.  Creatinine 1.19 and GFR 42 on 03/20/2023.  For significant change in renal function referred to nephrology.  Recheck lipids.   No follow-ups on file.   [name] Single, MD

## 2023-11-22 ENCOUNTER — Ambulatory Visit
Admission: RE | Admit: 2023-11-22 | Discharge: 2023-11-22 | Disposition: A | Discharge status: Home / Self Care | Source: Ambulatory Visit | Attending: Family Medicine | Admitting: Family Medicine

## 2023-11-22 DIAGNOSIS — Z1231 Encounter for screening mammogram for malignant neoplasm of breast: Secondary | ICD-10-CM | POA: Diagnosis not present

## 2023-11-29 ENCOUNTER — Ambulatory Visit: Payer: Self-pay | Admitting: Family Medicine

## 2023-12-05 ENCOUNTER — Encounter: Payer: PRIVATE HEALTH INSURANCE | Admitting: Family Medicine

## 2023-12-31 ENCOUNTER — Encounter: Payer: Self-pay | Admitting: Family Medicine

## 2023-12-31 ENCOUNTER — Ambulatory Visit (INDEPENDENT_AMBULATORY_CARE_PROVIDER_SITE_OTHER): Payer: PRIVATE HEALTH INSURANCE | Admitting: Family Medicine

## 2023-12-31 DIAGNOSIS — Z0000 Encounter for general adult medical examination without abnormal findings: Secondary | ICD-10-CM | POA: Diagnosis not present

## 2023-12-31 NOTE — Progress Notes (Signed)
 PATIENT CHECK-IN and HEALTH RISK ASSESSMENT QUESTIONNAIRE:  -completed by phone/video for upcoming Medicare Preventive Visit   Pre-Visit Check-in: 1)Vitals (height, wt, BP, etc) - record in vitals section for visit on day of visit Request home vitals (wt, BP, etc.) and enter into vitals, THEN update Vital Signs SmartPhrase below at the top of the HPI. See below.  2)Review and Update Medications, Allergies PMH, Surgeries, Social history in Epic 3)Hospitalizations in the last year with date/reason? NO   4)Review and Update Care Team (patient's specialists) in Epic 5) Complete PHQ9 in Epic  6) Complete Fall Screening in Epic 7)Review all Health Maintenance Due and order if not done.  Medicare Wellness Patient Questionnaire:  Answer theses question about your habits: How often do you have a drink containing alcohol?NO  How many drinks containing alcohol do you have on a typical day when you are drinking?Na  How often do you have six or more drinks on one occasion?Na Have you ever smoked?yes Quit date if applicable? 2008  How many packs a day do/did you smoke? Less than a pack a day  Do you use smokeless tobacco? no  Do you use an illicit drugs? no  On average, how many days per week do you engage in moderate to strenuous exercise (like a brisk walk)?yes, 3-4 days a week  On average, how many minutes do you engage in exercise at this level? 30 - 60 minutes a day Are you sexually active? No  Number of partners?Na  Typical breakfast: water , apple, yogurt fruit, egg sandwich  Typical lunch:salad, nuts, walnuts Typical dinner:broccoli kale, hot tea, tuna, salmon, chicken Typical snacks: Popcorn, nut   Beverages: water , milk, juice   Answer theses question about your everyday activities: Can you perform most household chores?yes  Are you deaf or have significant trouble hearing?yes, patient uses a hearing aid  Do you feel that you have a problem with memory?no  Do you feel safe at home?  yes  Last dentist visit?July 2025 8. Do you have any difficulty performing your everyday activities?no  Are you having any difficulty walking, taking medications on your own, and or difficulty managing daily home needs?no  Do you have difficulty walking or climbing stairs?no  Do you have difficulty dressing or bathing?no  Do you have difficulty doing errands alone such as visiting a doctor's office or shopping?Yes, patient doesn't like to drive  Do you currently have any difficulty preparing food and eating?NO  Do you currently have any difficulty using the toilet?No  Do you have any difficulty managing your finances?yes  Do you have any difficulties with housekeeping of managing your housekeeping?no    Do you have Advanced Directives in place (Living Will, Healthcare Power or Attorney)? yes   Last eye Exam and location?6 months ago, Dr. Kendra    Do you currently use prescribed or non-prescribed narcotic or opioid pain medications?no   Do you have a history or close family history of breast, ovarian, tubal or peritoneal cancer or a family member with BRCA (breast cancer susceptibility 1 and 2) gene mutations? No     Nurse/Assistant Credentials/time stamp: Rea A.Wright CMA 4:47 pm     ----------------------------------------------------------------------------------------------------------------------------------------------------------------------------------------------------------------------  Because this visit was a virtual/telehealth visit, some criteria may be missing or patient reported. Any vitals not documented were not able to be obtained and vitals that have been documented are patient reported.    MEDICARE ANNUAL PREVENTIVE VISIT WITH PROVIDER: (Welcome to Medicare, initial annual wellness or annual wellness exam)  Virtual  Visit via Phone Note  I connected with OLUWATAMILORE STARNES on 12/31/23 by phone and verified that I am speaking with the correct person using two  identifiers. She prefers a phone visit.   Location patient: home Location provider:work or home office Persons participating in the virtual visit: patient, provider  Concerns and/or follow up today: doing ok. Has some stress from life things... but has lots of friends she can talk with.    See HM section in Epic for other details of completed HM.    ROS: negative for report of fevers, unintentional weight loss, vision changes, vision loss, hearing loss or change, chest pain, sob, hemoptysis, melena, hematochezia, hematuria, falls, bleeding or bruising, thoughts of suicide or self harm, memory loss  Patient-completed extensive health risk assessment - reviewed and discussed with the patient: See Health Risk Assessment completed with patient prior to the visit either above or in recent phone note. This was reviewed in detailed with the patient today and appropriate recommendations, orders and referrals were placed as needed per Summary below and patient instructions.   Review of Medical History: -PMH, PSH, Family History and current specialty and care providers reviewed and updated and listed below   Patient Care Team: Mercer Clotilda SAUNDERS, MD as PCP - General (Family Medicine)   Past Medical History:  Diagnosis Date   Chronic kidney disease     Past Surgical History:  Procedure Laterality Date   CAPSULOTOMY Right 07/25/2012   Procedure: MINOR PROCEDURE YAG LASER CAPSULOTOMY;  Surgeon: Debby FORBES Sharalyn Mickey., MD;  Location: [facility] OR;  Service: Ophthalmology;  Laterality: Right;   CATARACT EXTRACTION W/PHACO Left 04/27/2015   Procedure: CATARACT EXTRACTION PHACO AND INTRAOCULAR LENS PLACEMENT (IOC) LEFT EYE;  Surgeon: Gaither Quan, MD;  Location: [facility] OR;  Service: Ophthalmology;  Laterality: Left;   COLONOSCOPY     EYE SURGERY Right    Caract    Social History   Socioeconomic History   Marital status: Widowed    Spouse name: Not on file   Number of children: Not on file   Years of  education: Not on file   Highest education level: Not on file  Occupational History   Not on file  Tobacco Use   Smoking status: Former    Types: Cigarettes   Smokeless tobacco: Not on file   Tobacco comments:    quit in 1995  Substance and Sexual Activity   Alcohol use: No   Drug use: No   Sexual activity: Not on file  Other Topics Concern   Not on file  Social History Narrative   Not on file   Social Drivers of Health   Financial Resource Strain: Low Risk  (08/10/2022)   Overall Financial Resource Strain (CARDIA)    Difficulty of Paying Living Expenses: Not hard at all  Food Insecurity: No Food Insecurity (08/10/2022)   Hunger Vital Sign    Worried About Running Out of Food in the Last Year: Never true    Ran Out of Food in the Last Year: Never true  Transportation Needs: No Transportation Needs (08/10/2022)   PRAPARE - Administrator, Civil Service (Medical): No    Lack of Transportation (Non-Medical): No  Physical Activity: Insufficiently Active (08/10/2022)   Exercise Vital Sign    Days of Exercise per Week: 3 days    Minutes of Exercise per Session: 30 min  Stress: No Stress Concern Present (08/10/2022)   [employer] of Occupational Health - Occupational Stress Questionnaire  Feeling of Stress : Not at all  Social Connections: Moderately Integrated (08/10/2022)   Social Connection and Isolation Panel    Frequency of Communication with Friends and Family: More than three times a week    Frequency of Social Gatherings with Friends and Family: More than three times a week    Attends Religious Services: More than 4 times per year    Active Member of [employer] or Organizations: Yes    Attends Banker Meetings: More than 4 times per year    Marital Status: Widowed  Intimate Partner Violence: Not At Risk (08/10/2022)   Humiliation, Afraid, Rape, and Kick questionnaire    Fear of Current or Ex-Partner: No    Emotionally Abused: No    Physically  Abused: No    Sexually Abused: No    Family History  Problem Relation Age of Onset   Obesity Daughter     Current Outpatient Medications on File Prior to Visit  Medication Sig Dispense Refill   Calcium Carbonate-Vitamin D  (CALCIUM 600 + D PO) Take 1 tablet by mouth daily.     carbamide peroxide (DEBROX) 6.5 % OTIC solution Place 5 drops into the left ear 2 (two) times daily. (Patient taking differently: Place 5 drops into the left ear 2 (two) times daily. As needed) 15 mL 0   Coenzyme Q10 10 MG capsule Take by mouth daily.     diclofenac Sodium (VOLTAREN) 1 % GEL Apply topically. (Patient taking differently: Apply topically as needed.)     Flaxseed, Linseed, (FLAX SEEDS PO) Take by mouth.     Fluocinolone Acetonide Scalp 0.01 % OIL Apply topically.     ketoconazole (NIZORAL) 2 % shampoo      Multiple Vitamins-Minerals (CENTRUM SILVER ADULT 50+ PO) Take 1 tablet by mouth daily.     omega-3 acid ethyl esters (LOVAZA ) 1 g capsule TAKE 1 CAPSULE FOUR TIMES A DAY 360 capsule 3   Polyethyl Glycol-Propyl Glycol (SYSTANE OP) Place 1-2 drops into both eyes 2 (two) times daily.     TURMERIC PO Take 1 tablet by mouth 2 (two) times daily. With ginger     No current facility-administered medications on file prior to visit.    No Known Allergies     Physical Exam Vitals requested from patient and listed below if patient had equipment and was able to obtain at home for this virtual visit: There were no vitals filed for this visit. Estimated body mass index is 33.66 kg/m as calculated from the following:   Height as of 11/18/23: 5' 3 (1.6 m).   Weight as of 11/18/23: 190 lb (86.2 kg).  EKG (optional): deferred due to virtual visit  GENERAL: alert, oriented, no acute distress detected, full vision exam deferred due to pandemic and/or virtual encounter  PSYCH/NEURO: pleasant and cooperative, no obvious depression or anxiety, speech and thought processing grossly intact, Cognitive function  grossly intact  Flowsheet Row Office Visit from 12/31/2023 in [facility]  PHQ-9 Total Score 1        12/31/2023    4:28 PM 07/12/2023   11:47 AM 04/10/2023    4:47 PM 03/20/2023    1:17 PM 08/10/2022    3:56 PM  Depression screen PHQ 2/9  Decreased Interest 0 0  0 0  Down, Depressed, Hopeless 0 0 0 0 0  PHQ - 2 Score 0 0 0 0 0  Altered sleeping 0 1 1 1  0  Tired, decreased energy 1 1  0 1 0  Change in appetite 0 0 0 0 0  Feeling bad or failure about yourself  0 0 0 0 0  Trouble concentrating 0 0 1 0 0  Moving slowly or fidgety/restless 0 0 0 0 0  Suicidal thoughts 0 0 0 0 0  PHQ-9 Score 1 2 2 2  0  Difficult doing work/chores   Not difficult at all Not difficult at all Not difficult at all       08/10/2022    3:59 PM 03/20/2023    1:17 PM 04/10/2023    4:47 PM 07/12/2023   11:47 AM 12/31/2023    4:27 PM  Fall Risk  Falls in the past year? 0 0 0 0 0  Was there an injury with Fall? 0 0 0 0 0  Fall Risk Category Calculator 0 0 0 0 0  Patient at Risk for Falls Due to No Fall Risks No Fall Risks No Fall Risks  No Fall Risks  Fall risk Follow up Falls prevention discussed Falls evaluation completed Falls evaluation completed Falls evaluation completed Falls evaluation completed     SUMMARY AND PLAN:  Encounter for Medicare annual wellness exam  Discussed applicable health maintenance/preventive health measures and advised and referred or ordered per patient preferences: -discussed vaccines recs/risks, advised can get at the pharmacy and to let us  know if does so that we can update her record.  Health Maintenance  Topic Date Due   INFLUENZA VACCINE  11/29/2023   COVID-19 Vaccine (6 - 2025-26 season) 12/30/2023   Pneumococcal Vaccine: 50+ Years (2 of 2 - PPSV23, PCV20, or PCV21) 12/30/2024 (Originally 06/11/2018)   Medicare Annual Wellness (AWV)  12/30/2024   DTaP/Tdap/Td (2 - Td or Tdap) 12/06/2025   DEXA SCAN  Completed   Zoster Vaccines-  Shingrix  Completed   HPV VACCINES  Aged Out   Meningococcal B Vaccine  Aged Out      Education and counseling on the following was provided based on the above review of health and a plan/checklist for the patient, along with additional information discussed, was provided for the patient in the patient instructions :  -Advised and counseled on a healthy lifestyle - including the importance of a healthy diet, regular physical activity, social connections and stress management. Offered counseling and info provided. She feels has friends to talk with and doing ok. -Reviewed patient's current diet. Advised and counseled on a whole foods based healthy diet. A summary of a healthy diet was provided in the Patient Instructions.  -reviewed patient's current physical activity level and discussed exercise guidelines for adults. Discussed community resources and ideas for safe exercise at home to assist in meeting exercise guideline recommendations in a safe and healthy way.  -Advise yearly dental visits at minimum and regular eye exams  Follow up: see patient instructions     Patient Instructions  I really enjoyed getting to talk with you today! I am available on Tuesdays and Thursdays for virtual visits if you have any questions or concerns, or if I can be of any further assistance.   CHECKLIST FROM ANNUAL WELLNESS VISIT:  -Follow up (please call to schedule if not scheduled after visit):   -yearly for annual wellness visit with primary care office  Here is a list of your preventive care/health maintenance measures and the plan for each if any are due:  PLAN For any measures below that may be due:    1. Can get the vaccines at the pharmacy. Please  let us  know if you do so that we can update your records. Thanks!  Health Maintenance  Topic Date Due   INFLUENZA VACCINE  11/29/2023   COVID-19 Vaccine (6 - 2025-26 season) 12/30/2023   Pneumococcal Vaccine: 50+ Years (2 of 2 - PPSV23, PCV20,  or PCV21) 12/30/2024 (Originally 06/11/2018)   Medicare Annual Wellness (AWV)  12/30/2024   DTaP/Tdap/Td (2 - Td or Tdap) 12/06/2025   DEXA SCAN  Completed   Zoster Vaccines- Shingrix  Completed   HPV VACCINES  Aged Out   Meningococcal B Vaccine  Aged Out    -See a dentist at least yearly  -Get your eyes checked and then per your eye specialist's recommendations  -Other issues addressed today:   -I have included below further information regarding a healthy whole foods based diet, physical activity guidelines for adults, stress management and opportunities for social connections. I hope you find this information useful.   -----------------------------------------------------------------------------------------------------------------------------------------------------------------------------------------------------------------------------------------------------------    NUTRITION: -eat real food: lots of colorful vegetables (half the plate) and fruits -5-7 servings of vegetables and fruits per day (fresh or steamed is best), exp. 2 servings of vegetables with lunch and dinner and 2 servings of fruit per day. Berries and greens such as kale and collards are great choices.  -consume on a regular basis:  fresh fruits, fresh veggies, fish, nuts, seeds, healthy oils (such as olive oil, avocado oil), whole grains (make sure for bread/pasta/crackers/etc., that the first ingredient on label contains the word whole), legumes. -can eat small amounts of dairy and lean meat (no larger than the palm of your hand), but avoid processed meats such as ham, bacon, lunch meat, etc. -drink water  -try to avoid fast food and pre-packaged foods, processed meat, ultra processed foods/beverages (donuts, candy, etc.) -most experts advise limiting sodium to < 2300mg  per day, should limit further is any chronic conditions such as high blood pressure, heart disease, diabetes, etc. The American Heart Association  advised that < 1500mg  is is ideal -try to avoid foods/beverages that contain any ingredients with names you do not recognize  -try to avoid foods/beverages  with added sugar or sweeteners/sweets  -try to avoid sweet drinks (including diet drinks): soda, juice, Gatorade, sweet tea, power drinks, diet drinks -try to avoid white rice, white bread, pasta (unless whole grain)  EXERCISE GUIDELINES FOR ADULTS: -if you wish to increase your physical activity, do so gradually and with the approval of your doctor -STOP and seek medical care immediately if you have any chest pain, chest discomfort or trouble breathing when starting or increasing exercise  -move and stretch your body, legs, feet and arms when sitting for long periods -Physical activity guidelines for optimal health in adults: -get at least 150 minutes per week of moderate exercise (can talk, but not sing); this is about 20-30 minutes of sustained activity 5-7 days per week or two 10-15 minute episodes of sustained activity 5-7 days per week -do some muscle building/resistance training/strength training at least 2 days per week  -balance exercises 3+ days per week:   Stand somewhere where you have something sturdy to hold onto if you lose balance    1) lift up on toes, then back down, start with 5x per day and work up to 20x   2) stand and lift one leg straight out to the side so that foot is a few inches of the floor, start with 5x each side and work up to 20x each side   3) stand on one foot,  start with 5 seconds each side and work up to 20 seconds on each side  If you need ideas or help with getting more active:  -Silver sneakers https://tools.silversneakers.com  -Walk with a Doc: http://www.duncan-Hofferber.com/  -try to include resistance (weight lifting/strength building) and balance exercises twice per week: or the following link for  ideas: http://castillo-powell.com/  BuyDucts.dk  STRESS MANAGEMENT: -can try meditating, or just sitting quietly with deep breathing while intentionally relaxing all parts of your body for 5 minutes daily -if you need further help with stress, anxiety or depression please follow up with your primary doctor or contact the wonderful folks at [facility]: [phone]  SOCIAL CONNECTIONS: -options in [place] if you wish to engage in more social and exercise related activities:  -Silver sneakers https://tools.silversneakers.com  -Walk with a Doc: http://www.duncan-Sharples.com/  -Check out the [facility] Active Adults 50+ section on the [place] [employer] (hiking clubs, book clubs, cards and games, chess, exercise classes, aquatic classes and much more) - see the website for details: https://www.[place]-.gov/departments/parks-recreation/active-adults50  -YouTube has lots of exercise videos for different ages and abilities as well  -[facility] (a variety of indoor and outdoor inperson activities for adults). [phone]. [address].  -Virtual Online Classes (a variety of topics): see seniorplanet.org or call [phone]  -consider volunteering at a school, hospice center, church, senior center or elsewhere            Chiquita JONELLE Cramp, DO

## 2023-12-31 NOTE — Patient Instructions (Signed)
 I really enjoyed getting to talk with you today! I am available on Tuesdays and Thursdays for virtual visits if you have any questions or concerns, or if I can be of any further assistance.   CHECKLIST FROM ANNUAL WELLNESS VISIT:  -Follow up (please call to schedule if not scheduled after visit):   -yearly for annual wellness visit with primary care office  Here is a list of your preventive care/health maintenance measures and the plan for each if any are due:  PLAN For any measures below that may be due:    1. Can get the vaccines at the pharmacy. Please let us  know if you do so that we can update your records. Thanks!  Health Maintenance  Topic Date Due   INFLUENZA VACCINE  11/29/2023   COVID-19 Vaccine (6 - 2025-26 season) 12/30/2023   Pneumococcal Vaccine: 50+ Years (2 of 2 - PPSV23, PCV20, or PCV21) 12/30/2024 (Originally 06/11/2018)   Medicare Annual Wellness (AWV)  12/30/2024   DTaP/Tdap/Td (2 - Td or Tdap) 12/06/2025   DEXA SCAN  Completed   Zoster Vaccines- Shingrix  Completed   HPV VACCINES  Aged Out   Meningococcal B Vaccine  Aged Out    -See a dentist at least yearly  -Get your eyes checked and then per your eye specialist's recommendations  -Other issues addressed today:   -I have included below further information regarding a healthy whole foods based diet, physical activity guidelines for adults, stress management and opportunities for social connections. I hope you find this information useful.   -----------------------------------------------------------------------------------------------------------------------------------------------------------------------------------------------------------------------------------------------------------    NUTRITION: -eat real food: lots of colorful vegetables (half the plate) and fruits -5-7 servings of vegetables and fruits per day (fresh or steamed is best), exp. 2 servings of vegetables with lunch and dinner and 2  servings of fruit per day. Berries and greens such as kale and collards are great choices.  -consume on a regular basis:  fresh fruits, fresh veggies, fish, nuts, seeds, healthy oils (such as olive oil, avocado oil), whole grains (make sure for bread/pasta/crackers/etc., that the first ingredient on label contains the word whole), legumes. -can eat small amounts of dairy and lean meat (no larger than the palm of your hand), but avoid processed meats such as ham, bacon, lunch meat, etc. -drink water  -try to avoid fast food and pre-packaged foods, processed meat, ultra processed foods/beverages (donuts, candy, etc.) -most experts advise limiting sodium to < 2300mg  per day, should limit further is any chronic conditions such as high blood pressure, heart disease, diabetes, etc. The American Heart Association advised that < 1500mg  is is ideal -try to avoid foods/beverages that contain any ingredients with names you do not recognize  -try to avoid foods/beverages  with added sugar or sweeteners/sweets  -try to avoid sweet drinks (including diet drinks): soda, juice, Gatorade, sweet tea, power drinks, diet drinks -try to avoid white rice, white bread, pasta (unless whole grain)  EXERCISE GUIDELINES FOR ADULTS: -if you wish to increase your physical activity, do so gradually and with the approval of your doctor -STOP and seek medical care immediately if you have any chest pain, chest discomfort or trouble breathing when starting or increasing exercise  -move and stretch your body, legs, feet and arms when sitting for long periods -Physical activity guidelines for optimal health in adults: -get at least 150 minutes per week of moderate exercise (can talk, but not sing); this is about 20-30 minutes of sustained activity 5-7 days per week or two 10-15 minute  episodes of sustained activity 5-7 days per week -do some muscle building/resistance training/strength training at least 2 days per week  -balance  exercises 3+ days per week:   Stand somewhere where you have something sturdy to hold onto if you lose balance    1) lift up on toes, then back down, start with 5x per day and work up to 20x   2) stand and lift one leg straight out to the side so that foot is a few inches of the floor, start with 5x each side and work up to 20x each side   3) stand on one foot, start with 5 seconds each side and work up to 20 seconds on each side  If you need ideas or help with getting more active:  -Silver sneakers https://tools.silversneakers.com  -Walk with a Doc: http://www.duncan-Manas.com/  -try to include resistance (weight lifting/strength building) and balance exercises twice per week: or the following link for ideas: http://castillo-powell.com/  BuyDucts.dk  STRESS MANAGEMENT: -can try meditating, or just sitting quietly with deep breathing while intentionally relaxing all parts of your body for 5 minutes daily -if you need further help with stress, anxiety or depression please follow up with your primary doctor or contact the wonderful folks at [facility]: [phone]  SOCIAL CONNECTIONS: -options in [place] if you wish to engage in more social and exercise related activities:  -Silver sneakers https://tools.silversneakers.com  -Walk with a Doc: http://www.duncan-Jansma.com/  -Check out the [place] Active Adults 50+ section on the [place] of [employer] (hiking clubs, book clubs, cards and games, chess, exercise classes, aquatic classes and much more) - see the website for details: https://www.[place].gov/departments/parks-recreation/active-adults50  -YouTube has lots of exercise videos for different ages and abilities as well  -[facility] (a variety of indoor and outdoor inperson activities for adults). [phone]. [address]  -Virtual Online  Classes (a variety of topics): see seniorplanet.org or call [phone]  -consider volunteering at a school, hospice center, church, senior center or elsewhere

## 2024-01-04 DIAGNOSIS — Z23 Encounter for immunization: Secondary | ICD-10-CM | POA: Diagnosis not present

## 2024-01-16 ENCOUNTER — Encounter: Attending: Family Medicine | Admitting: Dietician

## 2024-01-16 ENCOUNTER — Encounter: Payer: Self-pay | Admitting: Dietician

## 2024-01-16 DIAGNOSIS — N1832 Chronic kidney disease, stage 3b: Secondary | ICD-10-CM | POA: Diagnosis not present

## 2024-01-16 DIAGNOSIS — R030 Elevated blood-pressure reading, without diagnosis of hypertension: Secondary | ICD-10-CM | POA: Diagnosis not present

## 2024-01-16 NOTE — Progress Notes (Signed)
 Medical Nutrition Therapy  Appointment Start time:  [id number]  Appointment End time:  1225  Primary concerns today: what foods to eat with kidney disease Referral diagnosis: N18.32,R03.0 (CKD stage 3b, elevated blood pressure) Preferred learning style: no preference indicated (auditory, visual, hands on, no preference indicated) Learning readiness: preparation (not ready, contemplating, ready, change in progress)  NUTRITION ASSESSMENT   Clinical Medical Hx: CKD stage 3b Medications: calcium 600 mg, multivitamin, flax seeds, Coenzyme Q10, tumeric, tylenol , systane, omega 3 Labs: glucose 105, GFR 50.95, LDL 106 Notable Signs/Symptoms: none noted  Lifestyle & Dietary Hx Pt arrived with her sister in law. Pt states one doctor told her that she had an overactive bladder and one doctor told her that she has kidney disease, stating both doctors are family medicine doctors. Pt states her doctor recommended 7 - 8 oz cups of fluid per day.  Estimated daily fluid intake: 56-64 oz Supplements: calcium 600 mg, multivitamin, flax seeds, Coenzyme Q10, tumeric, tylenol , systane, omega 3 Sleep: 5-6 hours, states she drinks hot cherry juice, and sleepy time tea Stress / self-care: 2 on a scale of 1-10; yard work, talk on the phone Current average weekly physical activity: walking two times a day for 20-25 minutes (total 40-50 minutes)  24-Hr Dietary Recall First Meal: water , coffee, oatmeal or yogurt (with nuts and flax seeds) or egg or toast, apple (daily) or spinach or kale Snack:  Second Meal: salad with lots of vegetables and also with walnuts or pecans or [place] or almonds, milk with ginger snaps Snack: smoothie (blueberry, coconut water ) Third Meal: peanut butter cracker and fruit Snack:  Beverages: coffee, water , juice, milk,   NUTRITION DIAGNOSIS  NB-1.1 Food and nutrition-related knowledge deficit As related to stage 3b CKD.  As evidenced by first visit of this type and request for  information.  NUTRITION INTERVENTION  Nutrition education (E-1) on the following topics:  For someone with stage 3b chronic kidney disease (CKD), eating healthy is key to slowing disease progression and protecting kidney function. Focus on a low-sodium diet (less than 2,300 mg/day) to help control blood pressure, and choose heart-healthy fats like olive oil while avoiding saturated and trans fats. Protein intake should be moderate, with an emphasis on high-quality sources such as fish, poultry, and plant-based options. Limit foods high in phosphorus (like processed meats and cola) and potassium (like bananas and tomatoes) if lab tests show elevated levels. Cooking meals from scratch, using herbs instead of salt. Emphasis is placed on balancing carbohydrates and healthy fats to ensure adequate energy, while keeping protein intake moderate. Reading food labels, avoiding processed foods, and choosing fresh, whole ingredients are essential. Creative meal planning and a positive mindset can make dietary changes more enjoyable and sustainable. To help manage stage 3b chronic kidney disease, limit sodium intake to reduce fluid retention and control blood pressure, which can help protect both the heart and kidneys. Sodium is commonly found in salt, processed foods, canned goods, and restaurant meals, so it's important to read Nutrition Facts labels and choose low-sodium or no-salt-added options.  Handouts Provided Include  Chronic Kidney Disease Stage 3 Nutrition Therapy (tips, food recommendations and sample meal plans from Diet Manual) Dish Up a Kidney Friendly Meal (food lists and plate examples)  Learning Style & Readiness for Change Teaching method utilized: Visual & Auditory  Demonstrated degree of understanding via: Teach Back  Barriers to learning/adherence to lifestyle change: nothing identified  Goals Established by Pt 50 grams of protein per day 56 oz of  fluids per day  MONITORING &  EVALUATION Dietary intake, weekly physical activity.  Next Steps  Patient will call for an appointment as needed.

## 2024-01-22 DIAGNOSIS — H04123 Dry eye syndrome of bilateral lacrimal glands: Secondary | ICD-10-CM | POA: Diagnosis not present

## 2024-01-22 DIAGNOSIS — H43813 Vitreous degeneration, bilateral: Secondary | ICD-10-CM | POA: Diagnosis not present

## 2024-01-22 DIAGNOSIS — Z961 Presence of intraocular lens: Secondary | ICD-10-CM | POA: Diagnosis not present

## 2024-03-25 ENCOUNTER — Ambulatory Visit (INDEPENDENT_AMBULATORY_CARE_PROVIDER_SITE_OTHER): Admitting: Family Medicine

## 2024-03-25 ENCOUNTER — Encounter: Payer: Self-pay | Admitting: Family Medicine

## 2024-03-25 VITALS — BP 150/78 | HR 78 | Temp 98.5°F | Ht 63.0 in | Wt 185.2 lb

## 2024-03-25 DIAGNOSIS — N1832 Chronic kidney disease, stage 3b: Secondary | ICD-10-CM

## 2024-03-25 DIAGNOSIS — E782 Mixed hyperlipidemia: Secondary | ICD-10-CM | POA: Diagnosis not present

## 2024-03-25 DIAGNOSIS — F419 Anxiety disorder, unspecified: Secondary | ICD-10-CM

## 2024-03-25 DIAGNOSIS — R030 Elevated blood-pressure reading, without diagnosis of hypertension: Secondary | ICD-10-CM

## 2024-03-25 NOTE — Progress Notes (Signed)
 "  Established Patient Office Visit   Subjective  Patient ID: [name], female    DOB: [birth year]  Age: 83 y.o. MRN: [id number]  Chief Complaint  Patient presents with   Medical Management of Chronic Issues    Patient came in today for a 4 month follow-up for Blood pressure, CKD, and Anxiety     Pt is a 83 yo female seen for f/u.  BP at home 134/64, 134/64, 128/65, 129/73, 123/65.  Monitoring sodium intake.  Continuing diet modifications to lower cholesterol.  Pt endorses b/l heel discomfort, not a pain. Rubbed on a sheet and felt better.  Last night R shin sore but went away.  Tried heat.  Pt with continued anxiety.  Open to counseling.      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   Class 1 obesity with serious comorbidity and body mass index (BMI) of 34.0 to 34.9 in adult 09/19/2022   Sensorineural hearing loss (SNHL) of both ears 09/19/2022   Syncope 02/20/2018   Hyperglycemia 02/20/2018   Mild renal insufficiency 02/20/2018   Past Medical History:  Diagnosis Date   Chronic kidney disease    Past Surgical History:  Procedure Laterality Date   CAPSULOTOMY Right 07/25/2012   Procedure: MINOR PROCEDURE YAG LASER CAPSULOTOMY;  Surgeon: Debby FORBES Sharalyn Mickey., MD;  Location: [facility] OR;  Service: Ophthalmology;  Laterality: Right;   CATARACT EXTRACTION W/PHACO Left 04/27/2015   Procedure: CATARACT EXTRACTION PHACO AND INTRAOCULAR LENS PLACEMENT (IOC) LEFT EYE;  Surgeon: Gaither Quan, MD;  Location[facility] OR;  Service: Ophthalmology;  Laterality: Left;   COLONOSCOPY     EYE SURGERY Right    Caract   Social History   Tobacco Use   Smoking status: Former    Types: Cigarettes   Tobacco comments:    quit in 1995  Substance Use Topics   Alcohol use: No   Drug use: No   Family History  Problem Relation Age of Onset   Obesity  Daughter    No Known Allergies  ROS Negative unless stated above    Objective:     BP (!) 158/78 (BP Location: Left Arm, Patient Position: Sitting, Cuff Size: Large)   Pulse 78   Temp 98.5 F (36.9 C) (Oral)   Ht 5' 3 (1.6 m)   Wt 185 lb 3.2 oz (84 kg)   SpO2 99%   BMI 32.81 kg/m  BP Readings from Last 3 Encounters:  03/25/24 (!) 158/78  11/18/23 135/73  08/14/23 138/69   Wt Readings from Last 3 Encounters:  03/25/24 185 lb 3.2 oz (84 kg)  11/18/23 190 lb (86.2 kg)  08/14/23 193 lb 6.4 oz (87.7 kg)      Physical Exam Constitutional:      General: She is not in acute distress.    Appearance: Normal appearance.  HENT:     Head: Normocephalic and atraumatic.     Nose: Nose normal.     Mouth/Throat:     Mouth: Mucous membranes are moist.  Cardiovascular:     Rate and Rhythm: Normal rate and regular rhythm.     Heart sounds: Normal heart sounds. No murmur heard.    No gallop.  Pulmonary:     Effort: Pulmonary effort is normal. No respiratory distress.     Breath sounds: Normal breath sounds. No wheezing, rhonchi or rales.  Skin:    General: Skin is warm and dry.  Neurological:     Mental Status: She is alert and oriented to person, place, and time.        03/25/2024    3:54 PM 01/16/2024   11:19 AM 12/31/2023    4:28 PM  Depression screen PHQ 2/9  Decreased Interest 0 0 0  Down, Depressed, Hopeless 0 0 0  PHQ - 2 Score 0 0 0  Altered sleeping 1  0  Tired, decreased energy 1  1  Change in appetite 0  0  Feeling bad or failure about yourself  0  0  Trouble concentrating 0  0  Moving slowly or fidgety/restless 0  0  Suicidal thoughts 0  0  PHQ-9 Score 2  1   Difficult doing work/chores Not difficult at all       Data saved with a previous flowsheet row definition      03/25/2024    3:53 PM 12/31/2023    4:31 PM 07/12/2023   11:47 AM 04/10/2023    4:48 PM  GAD 7 : Generalized Anxiety Score  Nervous, Anxious, on Edge 0 1 0 0  Control/stop worrying   2 0 1  Worry too much - different things 1 2 2 1   Trouble relaxing 0 0 0   Restless 0 0 0 0  Easily annoyed or irritable 0 0 0 0  Afraid - awful might happen 1 1 0 1  Total GAD 7 Score  6 2   Anxiety Difficulty    Not difficult at all     No results found for any visits on 03/25/24.    Assessment & Plan:   Anxiety -     Ambulatory referral to Psychiatry  Chronic kidney disease, stage 3b (HCC)  Mixed hyperlipidemia  Elevated blood pressure reading in office without diagnosis of hypertension   Continued anxiety.  GAD 7 score 2 or more as pt did not complete one of the questions.  PHQ 9 score 2.  Referral to Psychiatry placed for counseling etc. CKD 3b stable/improving.  GFR 50.95.  Nephrology referral if needed.  Continue [place] 1 g.  Lifestyle modifications.  BP elevated. Recheck.  Continue current meds.    Return in about 4 months (around 07/23/2024).   Yolanda JONELLE Single, MD "

## 2024-08-07 ENCOUNTER — Ambulatory Visit: Admitting: Family Medicine
